# Patient Record
Sex: Female | Born: 1975 | Race: Black or African American | Hispanic: No | Marital: Single | Smoking: Never smoker
Health system: Southern US, Community
[De-identification: ages and names within clinical notes are randomized; demographics above are authoritative.]

## PROBLEM LIST (undated history)

## (undated) DIAGNOSIS — E039 Hypothyroidism, unspecified: Secondary | ICD-10-CM

## (undated) DIAGNOSIS — E119 Type 2 diabetes mellitus without complications: Secondary | ICD-10-CM

## (undated) DIAGNOSIS — K829 Disease of gallbladder, unspecified: Secondary | ICD-10-CM

## (undated) DIAGNOSIS — R0600 Dyspnea, unspecified: Secondary | ICD-10-CM

## (undated) DIAGNOSIS — I10 Essential (primary) hypertension: Secondary | ICD-10-CM

## (undated) DIAGNOSIS — D649 Anemia, unspecified: Secondary | ICD-10-CM

## (undated) DIAGNOSIS — G4730 Sleep apnea, unspecified: Secondary | ICD-10-CM

## (undated) DIAGNOSIS — E669 Obesity, unspecified: Secondary | ICD-10-CM

## (undated) DIAGNOSIS — M2550 Pain in unspecified joint: Secondary | ICD-10-CM

## (undated) DIAGNOSIS — M549 Dorsalgia, unspecified: Secondary | ICD-10-CM

## (undated) DIAGNOSIS — R87629 Unspecified abnormal cytological findings in specimens from vagina: Secondary | ICD-10-CM

## (undated) HISTORY — PX: CHOLECYSTECTOMY: SHX55

## (undated) HISTORY — DX: Unspecified abnormal cytological findings in specimens from vagina: R87.629

## (undated) HISTORY — DX: Type 2 diabetes mellitus without complications: E11.9

## (undated) HISTORY — DX: Disease of gallbladder, unspecified: K82.9

## (undated) HISTORY — DX: Essential (primary) hypertension: I10

## (undated) HISTORY — DX: Pain in unspecified joint: M25.50

## (undated) HISTORY — DX: Sleep apnea, unspecified: G47.30

## (undated) HISTORY — DX: Obesity, unspecified: E66.9

## (undated) HISTORY — DX: Dyspnea, unspecified: R06.00

## (undated) HISTORY — PX: COLPOSCOPY: SHX161

## (undated) HISTORY — DX: Hypothyroidism, unspecified: E03.9

## (undated) HISTORY — DX: Dorsalgia, unspecified: M54.9

## (undated) HISTORY — DX: Anemia, unspecified: D64.9

---

## 1999-11-05 ENCOUNTER — Other Ambulatory Visit: Admission: RE | Admit: 1999-11-05 | Discharge: 1999-11-05 | Payer: Self-pay | Admitting: Obstetrics and Gynecology

## 2000-11-17 ENCOUNTER — Other Ambulatory Visit: Admission: RE | Admit: 2000-11-17 | Discharge: 2000-11-17 | Payer: Self-pay | Admitting: Obstetrics and Gynecology

## 2002-09-05 ENCOUNTER — Encounter: Payer: Self-pay | Admitting: Endocrinology

## 2002-09-05 ENCOUNTER — Ambulatory Visit (HOSPITAL_COMMUNITY): Admission: RE | Admit: 2002-09-05 | Discharge: 2002-09-05 | Payer: Self-pay | Admitting: Endocrinology

## 2002-09-20 ENCOUNTER — Ambulatory Visit (HOSPITAL_COMMUNITY): Admission: RE | Admit: 2002-09-20 | Discharge: 2002-09-20 | Payer: Self-pay | Admitting: Endocrinology

## 2002-09-20 ENCOUNTER — Encounter: Payer: Self-pay | Admitting: Endocrinology

## 2002-11-21 ENCOUNTER — Other Ambulatory Visit: Admission: RE | Admit: 2002-11-21 | Discharge: 2002-11-21 | Payer: Self-pay | Admitting: Obstetrics and Gynecology

## 2002-11-21 ENCOUNTER — Other Ambulatory Visit: Admission: RE | Admit: 2002-11-21 | Discharge: 2002-11-21 | Payer: Self-pay | Admitting: *Deleted

## 2003-11-27 ENCOUNTER — Other Ambulatory Visit: Admission: RE | Admit: 2003-11-27 | Discharge: 2003-11-27 | Payer: Self-pay | Admitting: Obstetrics and Gynecology

## 2006-10-01 ENCOUNTER — Emergency Department (HOSPITAL_COMMUNITY): Admission: EM | Admit: 2006-10-01 | Discharge: 2006-10-01 | Payer: Self-pay | Admitting: Emergency Medicine

## 2007-09-02 HISTORY — PX: BUNIONECTOMY: SHX129

## 2013-10-24 ENCOUNTER — Other Ambulatory Visit: Payer: Self-pay | Admitting: Family Medicine

## 2013-10-24 DIAGNOSIS — R109 Unspecified abdominal pain: Secondary | ICD-10-CM

## 2013-10-26 ENCOUNTER — Ambulatory Visit
Admission: RE | Admit: 2013-10-26 | Discharge: 2013-10-26 | Disposition: A | Discharge status: Home / Self Care | Payer: 59 | Source: Ambulatory Visit | Attending: Family Medicine | Admitting: Family Medicine

## 2013-10-26 DIAGNOSIS — R109 Unspecified abdominal pain: Secondary | ICD-10-CM

## 2013-10-27 ENCOUNTER — Other Ambulatory Visit: Payer: Self-pay

## 2013-10-31 ENCOUNTER — Ambulatory Visit (INDEPENDENT_AMBULATORY_CARE_PROVIDER_SITE_OTHER): Payer: BC Managed Care – PPO | Admitting: Surgery

## 2013-10-31 ENCOUNTER — Encounter (INDEPENDENT_AMBULATORY_CARE_PROVIDER_SITE_OTHER): Payer: Self-pay | Admitting: Surgery

## 2013-10-31 VITALS — BP 132/76 | HR 86 | Temp 98.0°F | Resp 18 | Ht 66.0 in | Wt 220.0 lb

## 2013-10-31 DIAGNOSIS — K8020 Calculus of gallbladder without cholecystitis without obstruction: Secondary | ICD-10-CM | POA: Insufficient documentation

## 2013-10-31 NOTE — Patient Instructions (Signed)
Laparoscopic Cholecystectomy °Laparoscopic cholecystectomy is surgery to remove the gallbladder. The gallbladder is located in the upper right part of the abdomen, behind the liver. It is a storage sac for bile produced in the liver. Bile aids in the digestion and absorption of fats. Cholecystectomy is often done for inflammation of the gallbladder (cholecystitis). This condition is usually caused by a buildup of gallstones (cholelithiasis) in your gallbladder. Gallstones can block the flow of bile, resulting in inflammation and pain. In severe cases, emergency surgery may be required. When emergency surgery is not required, you will have time to prepare for the procedure. °Laparoscopic surgery is an alternative to open surgery. Laparoscopic surgery has a shorter recovery time. Your common bile duct may also need to be examined during the procedure. If stones are found in the common bile duct, they may be removed. °LET YOUR HEALTH CARE PROVIDER KNOW ABOUT: °· Any allergies you have. °· All medicines you are taking, including vitamins, herbs, eye drops, creams, and over-the-counter medicines. °· Previous problems you or members of your family have had with the use of anesthetics. °· Any blood disorders you have. °· Previous surgeries you have had. °· Medical conditions you have. °RISKS AND COMPLICATIONS °Generally, this is a safe procedure. However, as with any procedure, complications can occur. Possible complications include: °· Infection. °· Damage to the common bile duct, nerves, arteries, veins, or other internal organs such as the stomach, liver, or intestines. °· Bleeding. °· A stone may remain in the common bile duct. °· A bile leak from the cyst duct that is clipped when your gallbladder is removed. °· The need to convert to open surgery, which requires a larger incision in the abdomen. This may be necessary if your surgeon thinks it is not safe to continue with a laparoscopic procedure. °BEFORE THE  PROCEDURE °· Ask your health care provider about changing or stopping any regular medicines. You will need to stop taking aspirin or blood thinners at least 5 days prior to surgery. °· Do not eat or drink anything after midnight the night before surgery. °· Let your health care provider know if you develop a cold or other infectious problem before surgery. °PROCEDURE  °· You will be given medicine to make you sleep through the procedure (general anesthetic). A breathing tube will be placed in your mouth. °· When you are asleep, your surgeon will make several small cuts (incisions) in your abdomen. °· A thin, lighted tube with a tiny camera on the end (laparoscope) is inserted through one of the small incisions. The camera on the laparoscope sends a picture to a TV screen in the operating room. This gives the surgeon a good view inside your abdomen. °· A gas will be pumped into your abdomen. This expands your abdomen so that the surgeon has more room to perform the surgery. °· Other tools needed for the procedure are inserted through the other incisions. The gallbladder is removed through one of the incisions. °· After the removal of your gallbladder, the incisions will be closed with stitches, staples, or skin glue. °AFTER THE PROCEDURE °· You will be taken to a recovery area where your progress will be checked often. °· You may be allowed to go home the same day if your pain is controlled and you can tolerate liquids. °Document Released: 08/18/2005 Document Revised: 06/08/2013 Document Reviewed: 03/30/2013 °[employer]® Patient Information ©2014 [employer]. ° °

## 2013-10-31 NOTE — Progress Notes (Signed)
Patient ID: Sherry Palmer, female   DOB: November 19, 1975, 38 y.o.   MRN: [id number]  Chief Complaint  Patient presents with  . New Evaluation    Gallstones    HPI Sherry Palmer is Palmer 38 y.o. female.  Pt sent at request of Dr Darcus Austin for RUQ abdominal pain and gallstones.  U/S done shows Gallstones. Hx of RUQ pain after eating.  Last 2 hours or so. Sharp but more uncomfortable.  No nausea or vomiting.  Pain gets better on its own.  No radiations.  HPI  History reviewed. No pertinent past medical history.  Past Surgical History  Procedure Laterality Date  . Bunionectomy  2009    Family History  Problem Relation Age of Onset  . Hypertension Mother   . Hypothyroidism Mother   . Diabetes Mother   . Hyperlipidemia Mother   . Gout Mother   . Cancer Father     colon    Social History History  Substance Use Topics  . Smoking status: Never Smoker   . Smokeless tobacco: Not on file  . Alcohol Use: Not on file    Allergies  Allergen Reactions  . Amoxicillin Hives    Current Outpatient Prescriptions  Medication Sig Dispense Refill  . levothyroxine (LEVOTHROID) 137 MCG tablet Take 137 mcg by mouth daily before breakfast.      . losartan-hydrochlorothiazide (HYZAAR) 100-25 MG per tablet Take 1 tablet by mouth daily.      . metFORMIN (GLUCOPHAGE) 500 MG tablet Take by mouth 2 (two) times daily with Palmer meal.      . norethindrone-ethinyl estradiol (MICROGESTIN,JUNEL,LOESTRIN) 1-20 MG-MCG tablet Take 1 tablet by mouth daily.       No current facility-administered medications for this visit.    Review of Systems Review of Systems  Constitutional: Negative for fever, chills and unexpected weight change.  HENT: Negative for congestion, hearing loss, sore throat, trouble swallowing and voice change.   Eyes: Negative for visual disturbance.  Respiratory: Negative for cough and wheezing.   Cardiovascular: Negative for chest pain, palpitations and leg swelling.  Gastrointestinal:  Negative for nausea, vomiting, abdominal pain, diarrhea, constipation, blood in stool, abdominal distention and anal bleeding.  Genitourinary: Negative for hematuria, vaginal bleeding and difficulty urinating.  Musculoskeletal: Negative for arthralgias.  Skin: Negative for rash and wound.  Neurological: Negative for seizures, syncope and headaches.  Hematological: Negative for adenopathy. Does not bruise/bleed easily.  Psychiatric/Behavioral: Negative for confusion.    Blood pressure 132/76, pulse 86, temperature 98 F (36.7 C), resp. rate 18, height 5\' 6"  (1.676 m), weight 220 lb (99.791 kg).  Physical Exam Physical Exam  Constitutional: She is oriented to person, place, and time. She appears well-developed and well-nourished.  HENT:  Head: Normocephalic and atraumatic.  Eyes: Pupils are equal, round, and reactive to light. No scleral icterus.  Neck: Normal range of motion. Neck supple.  Cardiovascular: Normal rate and regular rhythm.   Pulmonary/Chest: Effort normal and breath sounds normal.  Abdominal: Soft. Bowel sounds are normal. She exhibits no distension. There is no tenderness. There is no rebound.  Musculoskeletal: Normal range of motion.  Neurological: She is alert and oriented to person, place, and time.  Skin: Skin is warm and dry.  Psychiatric: She has Palmer normal mood and affect. Her behavior is normal. Judgment and thought content normal.    Data Reviewed Gallstones without CBD dilation.    Assessment    Gallstones with symptoms.     Plan  Laparoscopic cholecystectomy.  The procedure has been discussed with the patient. Operative and non operative treatments have been discussed. Risks of surgery include bleeding, infection,  Common bile duct injury,  Injury to the stomach,liver, colon,small intestine, abdominal wall,  Diaphragm,  Major blood vessels,  And the need for an open procedure.  Other risks include worsening of medical problems, death,  DVT and pulmonary  embolism, and cardiovascular events.   Medical options have also been discussed. The patient has been informed of long term expectations of surgery and non surgical options,  The patient agrees to proceed.         Sherry Palmer. 10/31/2013, 3:25 PM

## 2013-11-30 ENCOUNTER — Other Ambulatory Visit (INDEPENDENT_AMBULATORY_CARE_PROVIDER_SITE_OTHER): Payer: Self-pay | Admitting: Surgery

## 2013-11-30 ENCOUNTER — Other Ambulatory Visit (INDEPENDENT_AMBULATORY_CARE_PROVIDER_SITE_OTHER): Payer: Self-pay

## 2013-11-30 DIAGNOSIS — K8010 Calculus of gallbladder with chronic cholecystitis without obstruction: Secondary | ICD-10-CM

## 2013-11-30 MED ORDER — PROMETHAZINE HCL 12.5 MG PO TABS: 12.5000 mg | ORAL_TABLET | Freq: Four times a day (QID) | ORAL | Status: DC | PRN

## 2013-11-30 MED ORDER — OXYCODONE-ACETAMINOPHEN 5-325 MG PO TABS: 1.0000 | ORAL_TABLET | ORAL | Status: DC | PRN

## 2013-12-12 ENCOUNTER — Ambulatory Visit (INDEPENDENT_AMBULATORY_CARE_PROVIDER_SITE_OTHER): Payer: BC Managed Care – PPO | Admitting: Surgery

## 2013-12-12 ENCOUNTER — Encounter (INDEPENDENT_AMBULATORY_CARE_PROVIDER_SITE_OTHER): Payer: Self-pay | Admitting: Surgery

## 2013-12-12 VITALS — BP 124/82 | HR 84 | Resp 14 | Ht 67.0 in | Wt 219.4 lb

## 2013-12-12 DIAGNOSIS — Z9889 Other specified postprocedural states: Secondary | ICD-10-CM | POA: Insufficient documentation

## 2013-12-12 NOTE — Patient Instructions (Signed)
Return as needed.  Resume full activity. 

## 2013-12-12 NOTE — Progress Notes (Signed)
she is here for a postop visit following laparoscopic cholecystectomy.  Diet is being tolerated, bowels are moving.  No problems with incisions.  PE:  ABD:  Soft, incisions clean/dry/intact and solid.  Assessment:  Doing well postop.  Plan:  Lowfat diet recommended.  Activities as tolerated.  Return visit prn.

## 2014-03-20 ENCOUNTER — Other Ambulatory Visit: Payer: Self-pay | Admitting: Family Medicine

## 2014-03-20 DIAGNOSIS — R932 Abnormal findings on diagnostic imaging of liver and biliary tract: Secondary | ICD-10-CM

## 2014-03-31 ENCOUNTER — Ambulatory Visit
Admission: RE | Admit: 2014-03-31 | Discharge: 2014-03-31 | Disposition: A | Discharge status: Home / Self Care | Payer: 59 | Source: Ambulatory Visit | Attending: Family Medicine | Admitting: Family Medicine

## 2014-03-31 DIAGNOSIS — R932 Abnormal findings on diagnostic imaging of liver and biliary tract: Secondary | ICD-10-CM

## 2014-04-04 ENCOUNTER — Ambulatory Visit
Admission: RE | Admit: 2014-04-04 | Discharge: 2014-04-04 | Disposition: A | Discharge status: Home / Self Care | Payer: 59 | Source: Ambulatory Visit | Attending: Family Medicine | Admitting: Family Medicine

## 2014-04-04 ENCOUNTER — Other Ambulatory Visit: Payer: Self-pay | Admitting: Family Medicine

## 2014-04-04 DIAGNOSIS — R935 Abnormal findings on diagnostic imaging of other abdominal regions, including retroperitoneum: Secondary | ICD-10-CM

## 2014-04-04 MED ORDER — IOHEXOL 300 MG/ML  SOLN
40.0000 mL | Freq: Once | INTRAMUSCULAR | Status: AC | PRN
  Administered 2014-04-04: 40 mL via ORAL

## 2014-04-04 MED ORDER — IOHEXOL 300 MG/ML  SOLN
125.0000 mL | Freq: Once | INTRAMUSCULAR | Status: AC | PRN
  Administered 2014-04-04: 125 mL via INTRAVENOUS

## 2014-04-05 ENCOUNTER — Other Ambulatory Visit: Payer: Self-pay | Admitting: Family Medicine

## 2014-04-05 DIAGNOSIS — R160 Hepatomegaly, not elsewhere classified: Secondary | ICD-10-CM

## 2014-04-09 ENCOUNTER — Ambulatory Visit
Admission: RE | Admit: 2014-04-09 | Discharge: 2014-04-09 | Disposition: A | Discharge status: Home / Self Care | Payer: 59 | Source: Ambulatory Visit | Attending: Family Medicine | Admitting: Family Medicine

## 2014-04-09 DIAGNOSIS — R160 Hepatomegaly, not elsewhere classified: Secondary | ICD-10-CM

## 2014-04-09 MED ORDER — GADOXETATE DISODIUM 0.25 MMOL/ML IV SOLN
9.0000 mL | Freq: Once | INTRAVENOUS | Status: AC | PRN
  Administered 2014-04-09: 9 mL via INTRAVENOUS

## 2014-08-01 ENCOUNTER — Other Ambulatory Visit: Payer: Self-pay | Admitting: Family Medicine

## 2014-08-01 DIAGNOSIS — D134 Benign neoplasm of liver: Secondary | ICD-10-CM

## 2014-08-14 ENCOUNTER — Ambulatory Visit
Admission: RE | Admit: 2014-08-14 | Discharge: 2014-08-14 | Disposition: A | Discharge status: Home / Self Care | Payer: 59 | Source: Ambulatory Visit | Attending: Family Medicine | Admitting: Family Medicine

## 2014-08-14 DIAGNOSIS — D134 Benign neoplasm of liver: Secondary | ICD-10-CM

## 2014-08-14 MED ORDER — GADOXETATE DISODIUM 0.25 MMOL/ML IV SOLN
10.0000 mL | Freq: Once | INTRAVENOUS | Status: AC | PRN
  Administered 2014-08-14: 10 mL via INTRAVENOUS

## 2015-08-23 ENCOUNTER — Other Ambulatory Visit: Payer: Self-pay | Admitting: Family Medicine

## 2015-08-23 DIAGNOSIS — D134 Benign neoplasm of liver: Secondary | ICD-10-CM

## 2015-09-02 NOTE — L&D Delivery Note (Signed)
Delivery Note At 9:41 PM a viable and healthy female was delivered via Vaginal, Spontaneous Delivery (Presentation: ROA, vtx;  ).  APGAR:8 ,9 ; weight pending .   Placenta status: spontaneous, intact not sent, .  Cord:  with the following complications: none.  Cord pH: none  Anesthesia:  epidural Episiotomy: None Lacerations: None Suture Repair: n/a Est. Blood Loss (mL): 250  Mom to postpartum.  Baby to Couplet care / Skin to Skin.  Sherry Palmer A 08/31/2016, 10:47 PM

## 2015-09-04 ENCOUNTER — Ambulatory Visit
Admission: RE | Admit: 2015-09-04 | Discharge: 2015-09-04 | Disposition: A | Discharge status: Home / Self Care | Payer: Commercial Managed Care - HMO | Source: Ambulatory Visit | Attending: Family Medicine | Admitting: Family Medicine

## 2015-09-04 DIAGNOSIS — D134 Benign neoplasm of liver: Secondary | ICD-10-CM

## 2015-09-04 MED ORDER — GADOXETATE DISODIUM 0.25 MMOL/ML IV SOLN
10.0000 mL | Freq: Once | INTRAVENOUS | Status: AC | PRN
  Administered 2015-09-04: 10 mL via INTRAVENOUS

## 2015-10-27 IMAGING — US US ABDOMEN COMPLETE
1 series · 13 of 25 positions shown · non-contrast
Comparison: Ultrasound 10/26/2013.

CLINICAL DATA: Follow-up liver abnormality.

EXAM:
ULTRASOUND ABDOMEN COMPLETE

[Series 1: us abdomen complete · 0.41mm/px · 13 of 81 slices shown]
[im 1/81]
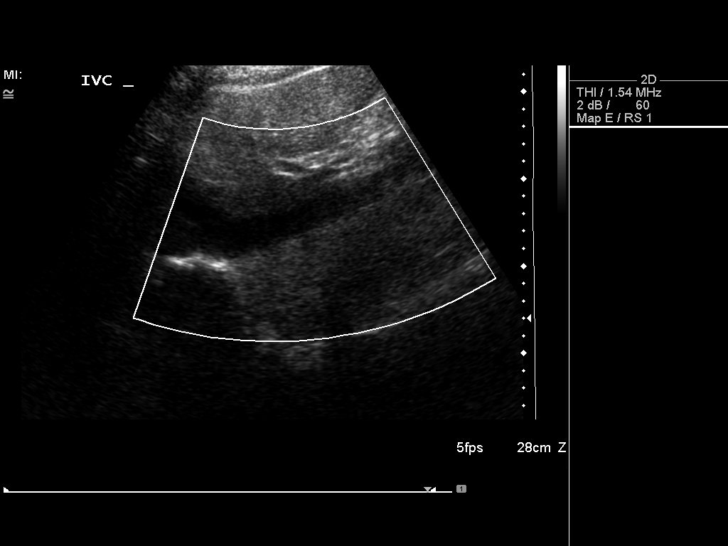
[im 7/81]
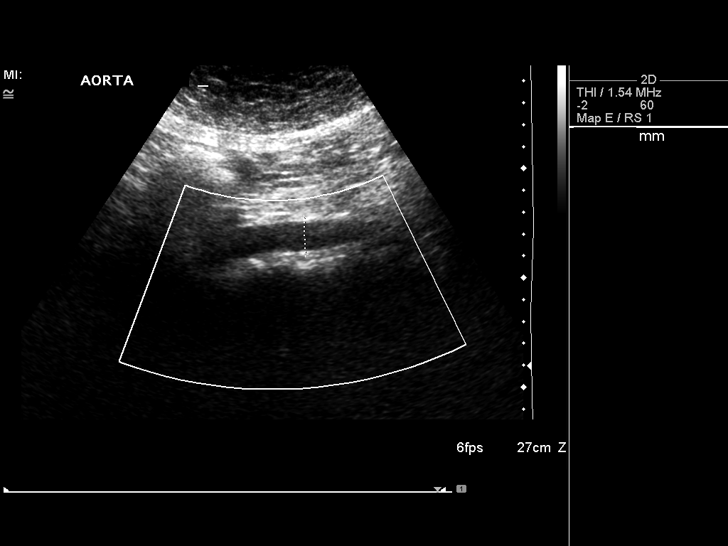
[im 14/81]
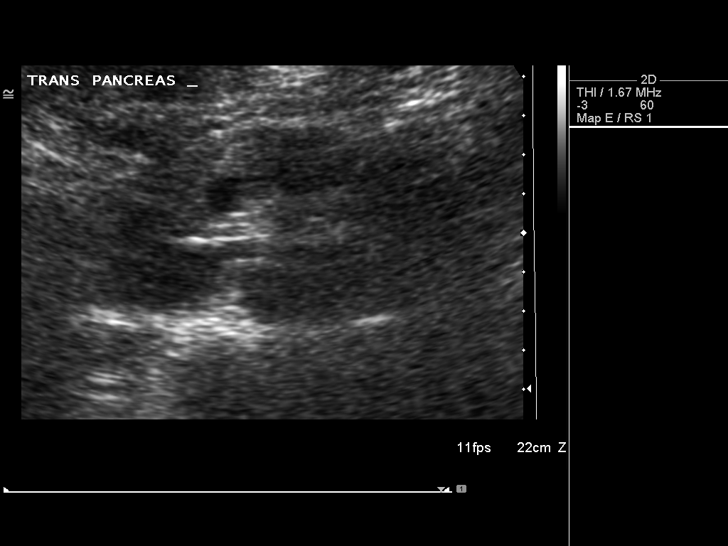
[im 21/81]
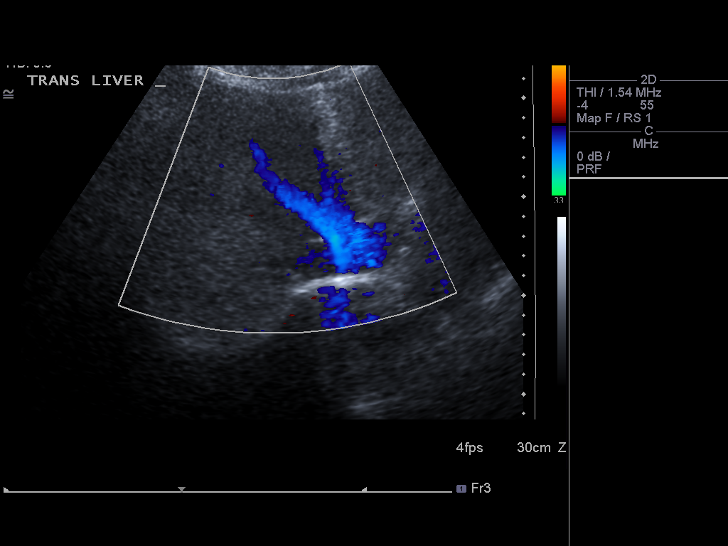
[im 27/81]
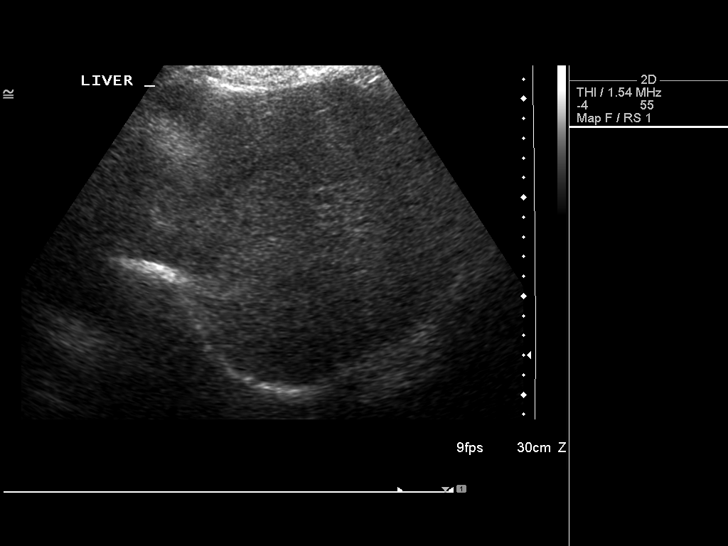
[im 34/81]
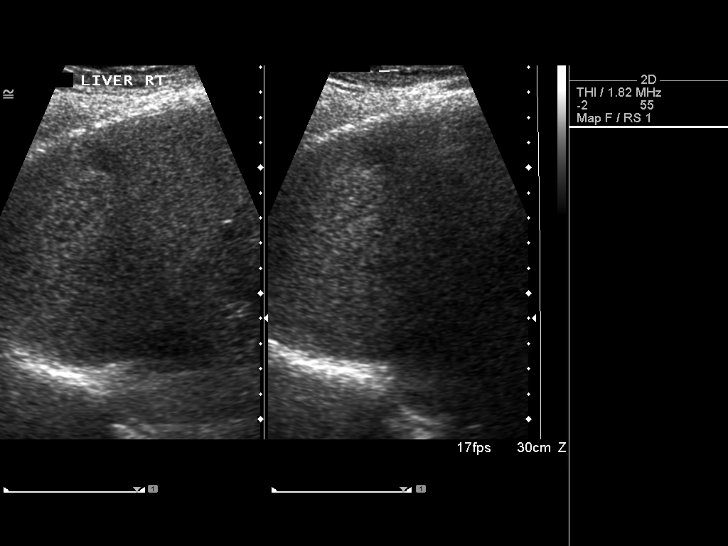
[im 41/81]
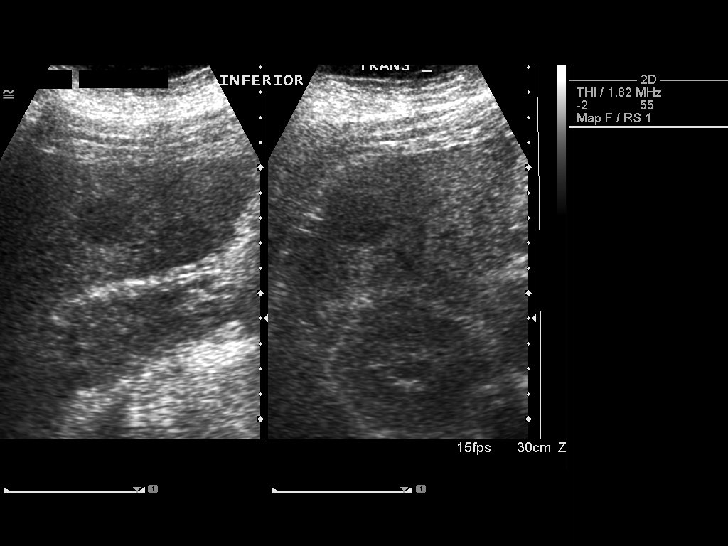
[im 47/81]
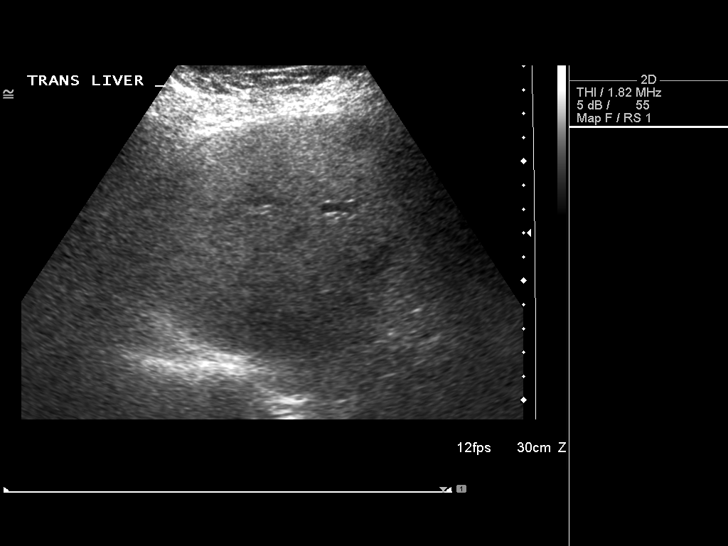
[im 54/81]
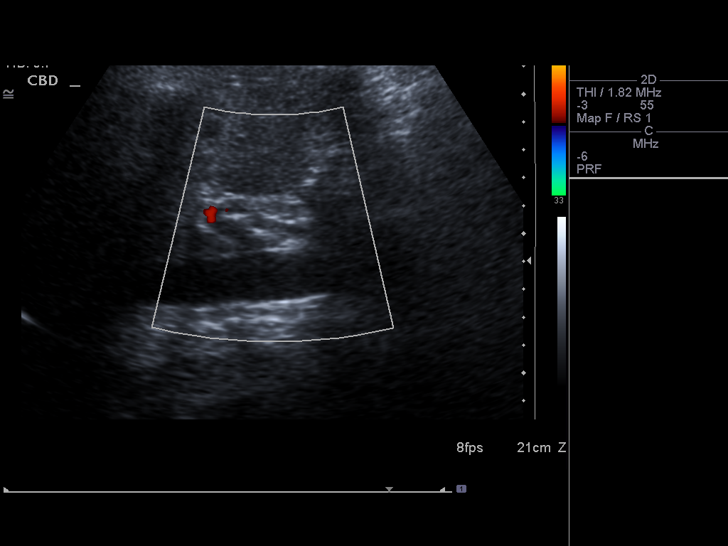
[im 61/81]
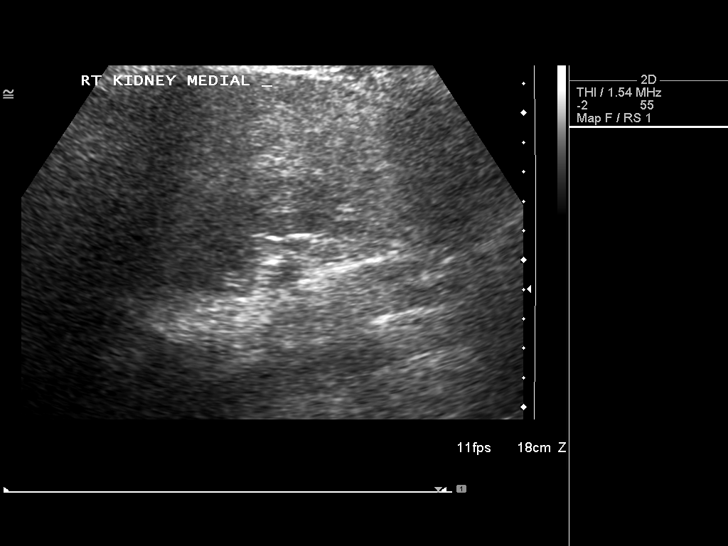
[im 67/81]
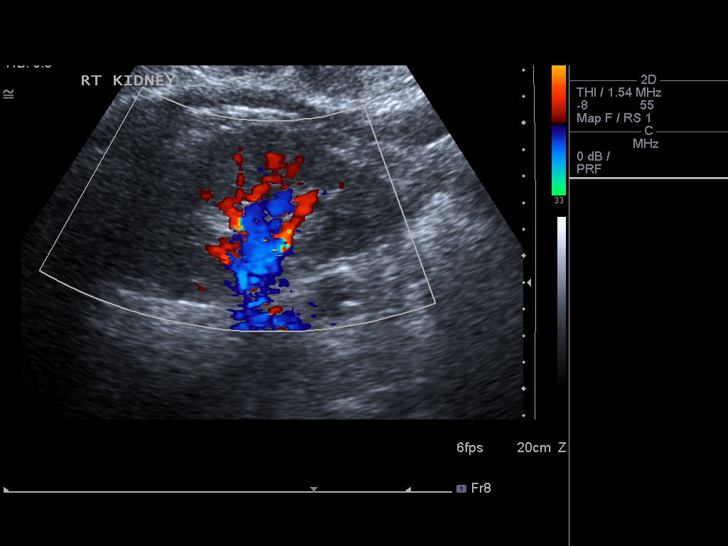
[im 74/81]
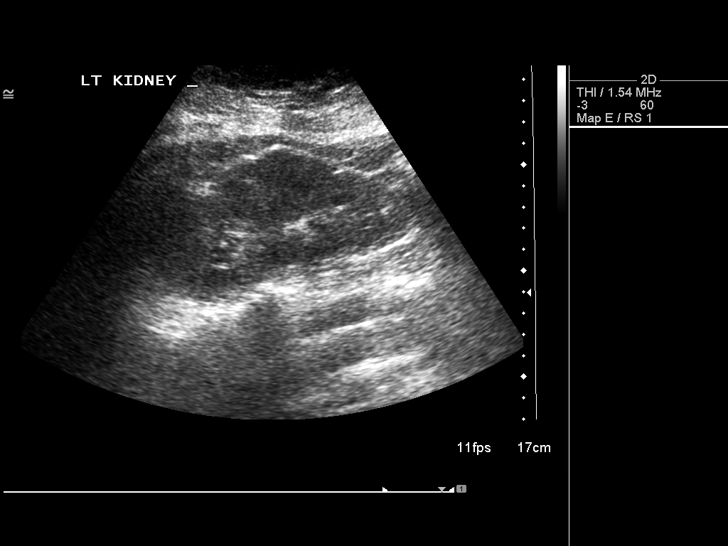
[im 81/81]
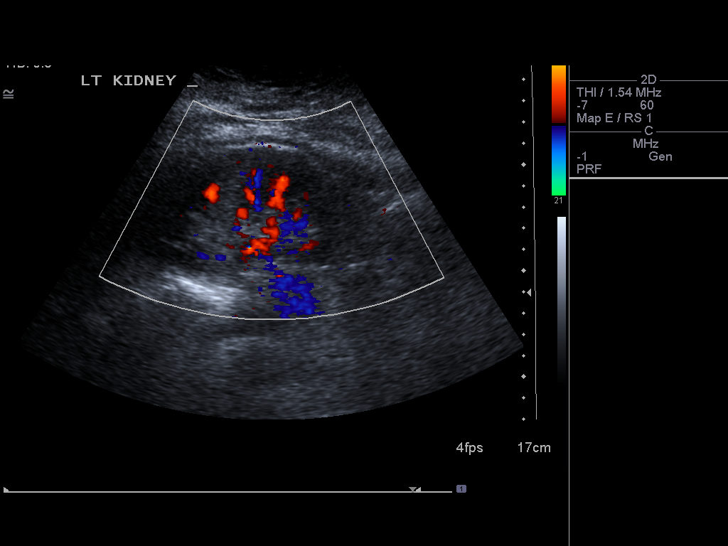

[13 of 25 positions shown; findings below may reference images not displayed]

FINDINGS: Gallbladder:

Cholecystectomy.

Common bile duct:

Diameter: 4.8 mm

Liver:

Noted on today's examination are multiple new solid appearing
hypoechoic nodules throughout both the right and left hepatic lobes.
Previously identified 2.3 cm left hepatic lobe hypoechoic nodule has
increased in size to approximately 3.5 cm. These lesions are very
concerning for metastasis. Contrast enhanced CT of the abdomen
pelvis suggested to further evaluate. Liver is hyperechoic
suggesting fatty infiltration.

IVC:

No abnormality visualized.

Pancreas:

Visualized portion unremarkable.

Spleen:

Size and appearance within normal limits.

Right Kidney:

Length: 11.0 cm. Echogenicity within normal limits. No mass or
hydronephrosis visualized.

Left Kidney:

Length: 12.1 cm. Echogenicity within normal limits. No mass or
hydronephrosis visualized.

Abdominal aorta:

No aneurysm visualized.

Other findings:

None.
IMPRESSION: 1. New onset of multiple solid-appearing hypoechoic nodules
throughout the liver. Interim growth of previously identified left
hepatic nodule from 2.3 cm to approximately 3.5 cm. These are very
concerning for metastatic lesions. Contrast-enhanced CT of the
abdomen and pelvis suggested to further evaluate.
2. Cholecystectomy.

## 2016-02-07 LAB — OB RESULTS CONSOLE GC/CHLAMYDIA
Chlamydia: NEGATIVE
Gonorrhea: NEGATIVE

## 2016-02-07 LAB — OB RESULTS CONSOLE ABO/RH: RH TYPE: POSITIVE

## 2016-02-07 LAB — OB RESULTS CONSOLE HIV ANTIBODY (ROUTINE TESTING): HIV: NONREACTIVE

## 2016-02-07 LAB — OB RESULTS CONSOLE HEPATITIS B SURFACE ANTIGEN: Hepatitis B Surface Ag: NEGATIVE

## 2016-02-07 LAB — OB RESULTS CONSOLE RUBELLA ANTIBODY, IGM: Rubella: IMMUNE

## 2016-02-07 LAB — OB RESULTS CONSOLE ANTIBODY SCREEN: Antibody Screen: NEGATIVE

## 2016-02-07 LAB — OB RESULTS CONSOLE RPR: RPR: NONREACTIVE

## 2016-03-12 ENCOUNTER — Encounter: Payer: Self-pay | Admitting: *Deleted

## 2016-03-12 ENCOUNTER — Encounter: Payer: 59 | Attending: Obstetrics and Gynecology | Admitting: *Deleted

## 2016-03-12 VITALS — Ht 67.0 in | Wt 230.1 lb

## 2016-03-12 DIAGNOSIS — O24319 Unspecified pre-existing diabetes mellitus in pregnancy, unspecified trimester: Secondary | ICD-10-CM | POA: Insufficient documentation

## 2016-03-12 DIAGNOSIS — E119 Type 2 diabetes mellitus without complications: Secondary | ICD-10-CM | POA: Diagnosis not present

## 2016-03-12 DIAGNOSIS — Z713 Dietary counseling and surveillance: Secondary | ICD-10-CM | POA: Diagnosis not present

## 2016-03-12 DIAGNOSIS — I10 Essential (primary) hypertension: Secondary | ICD-10-CM | POA: Insufficient documentation

## 2016-03-12 DIAGNOSIS — O24111 Pre-existing diabetes mellitus, type 2, in pregnancy, first trimester: Secondary | ICD-10-CM

## 2016-03-12 DIAGNOSIS — E669 Obesity, unspecified: Secondary | ICD-10-CM | POA: Insufficient documentation

## 2016-03-14 NOTE — Patient Instructions (Signed)
Plan:  Aim for 3 Carb Choices per meal (45 grams) +/- 1 either way  Aim for 0-1 Carbs per snack if hungry  Include protein in moderation with your meals and snacks Consider reading food labels for Total Carbohydrate of foods Consider  increasing your activity level by walking daily as tolerated Consider checking BG at alternate times per day  Continue taking diabetes medication as directed by MD

## 2016-03-14 NOTE — Progress Notes (Signed)
  Patient was seen on 03/12/16 for Type 2 Diabetes with pregnancy at the [facility]. The following learning objectives were met by the patient:   States the definition of type 2 Diabetes with pregnancy  States why dietary management is important in controlling blood glucose  Describes the effects each nutrient has on blood glucose levels  Demonstrates ability to create a balanced meal plan  Demonstrates carbohydrate counting   States when to check blood glucose levels  Demonstrates proper blood glucose monitoring techniques  States the effect of stress and exercise on blood glucose levels  States the importance of limiting caffeine and abstaining from alcohol and smoking  Action of Metformin and Glyburide  Potential causes, symptoms, treatment and prevention of hypoglycemia  Patient already has a meter and is testing pre breakfast and 2 hours post each meal. She states her FBG are running 120-125 and post meal 135-140 mg/dl. She has just filled her Rx for Glyburide and started that today pre breakfast and pre supper which should have a positive impact on her BG control.   Patient instructed to monitor glucose levels: FBS: 60 - <90 2 hour: <120  *Patient received handouts:  Nutrition Diabetes and Pregnancy  Carbohydrate Counting List  Patient will be seen for follow-up as needed.

## 2016-08-21 LAB — OB RESULTS CONSOLE GBS: GBS: POSITIVE

## 2016-08-27 ENCOUNTER — Other Ambulatory Visit: Payer: Self-pay | Admitting: Obstetrics and Gynecology

## 2016-08-27 ENCOUNTER — Telehealth (HOSPITAL_COMMUNITY): Payer: Self-pay | Admitting: *Deleted

## 2016-08-27 ENCOUNTER — Encounter (HOSPITAL_COMMUNITY): Payer: Self-pay | Admitting: *Deleted

## 2016-08-28 NOTE — Telephone Encounter (Signed)
Preadmission screen  

## 2016-08-31 ENCOUNTER — Inpatient Hospital Stay (HOSPITAL_COMMUNITY)
Admission: AD | Admit: 2016-08-31 | Discharge: 2016-08-31 | Disposition: A | Discharge status: Home / Self Care | Payer: 59 | Source: Ambulatory Visit | Attending: Obstetrics and Gynecology | Admitting: Obstetrics and Gynecology

## 2016-08-31 ENCOUNTER — Encounter (HOSPITAL_COMMUNITY): Payer: Self-pay | Admitting: *Deleted

## 2016-08-31 ENCOUNTER — Inpatient Hospital Stay (HOSPITAL_COMMUNITY): Payer: 59 | Admitting: Anesthesiology

## 2016-08-31 ENCOUNTER — Encounter (HOSPITAL_COMMUNITY)
Admission: AD | Disposition: A | Discharge status: Home / Self Care | Payer: Self-pay | Source: Ambulatory Visit | Attending: Obstetrics and Gynecology

## 2016-08-31 ENCOUNTER — Inpatient Hospital Stay (HOSPITAL_COMMUNITY)
Admission: AD | Admit: 2016-08-31 | Discharge: 2016-09-02 | DRG: 774 | Disposition: A | Discharge status: Home / Self Care | Payer: 59 | Source: Ambulatory Visit | Attending: Obstetrics and Gynecology | Admitting: Obstetrics and Gynecology

## 2016-08-31 ENCOUNTER — Encounter (HOSPITAL_COMMUNITY): Payer: Self-pay

## 2016-08-31 DIAGNOSIS — Z8249 Family history of ischemic heart disease and other diseases of the circulatory system: Secondary | ICD-10-CM | POA: Diagnosis not present

## 2016-08-31 DIAGNOSIS — O4202 Full-term premature rupture of membranes, onset of labor within 24 hours of rupture: Secondary | ICD-10-CM | POA: Diagnosis present

## 2016-08-31 DIAGNOSIS — E039 Hypothyroidism, unspecified: Secondary | ICD-10-CM | POA: Diagnosis present

## 2016-08-31 DIAGNOSIS — E119 Type 2 diabetes mellitus without complications: Secondary | ICD-10-CM | POA: Diagnosis present

## 2016-08-31 DIAGNOSIS — O2412 Pre-existing diabetes mellitus, type 2, in childbirth: Secondary | ICD-10-CM | POA: Diagnosis present

## 2016-08-31 DIAGNOSIS — Z3A38 38 weeks gestation of pregnancy: Secondary | ICD-10-CM | POA: Diagnosis not present

## 2016-08-31 DIAGNOSIS — O1002 Pre-existing essential hypertension complicating childbirth: Secondary | ICD-10-CM | POA: Diagnosis present

## 2016-08-31 DIAGNOSIS — O9081 Anemia of the puerperium: Secondary | ICD-10-CM | POA: Diagnosis not present

## 2016-08-31 DIAGNOSIS — O99824 Streptococcus B carrier state complicating childbirth: Secondary | ICD-10-CM | POA: Diagnosis present

## 2016-08-31 DIAGNOSIS — Z880 Allergy status to penicillin: Secondary | ICD-10-CM

## 2016-08-31 DIAGNOSIS — O99284 Endocrine, nutritional and metabolic diseases complicating childbirth: Secondary | ICD-10-CM | POA: Diagnosis present

## 2016-08-31 DIAGNOSIS — D573 Sickle-cell trait: Secondary | ICD-10-CM | POA: Diagnosis present

## 2016-08-31 DIAGNOSIS — Z833 Family history of diabetes mellitus: Secondary | ICD-10-CM | POA: Diagnosis not present

## 2016-08-31 DIAGNOSIS — Z794 Long term (current) use of insulin: Secondary | ICD-10-CM | POA: Diagnosis not present

## 2016-08-31 DIAGNOSIS — Z3493 Encounter for supervision of normal pregnancy, unspecified, third trimester: Secondary | ICD-10-CM | POA: Insufficient documentation

## 2016-08-31 DIAGNOSIS — D62 Acute posthemorrhagic anemia: Secondary | ICD-10-CM | POA: Diagnosis not present

## 2016-08-31 DIAGNOSIS — O10919 Unspecified pre-existing hypertension complicating pregnancy, unspecified trimester: Secondary | ICD-10-CM | POA: Diagnosis present

## 2016-08-31 LAB — GLUCOSE, CAPILLARY
Glucose-Capillary: 121 mg/dL — ABNORMAL HIGH (ref 65–99)
Glucose-Capillary: 97 mg/dL (ref 65–99)
Glucose-Capillary: 95 mg/dL (ref 65–99)
Glucose-Capillary: 97 mg/dL (ref 65–99)
Glucose-Capillary: 112 mg/dL — ABNORMAL HIGH (ref 65–99)

## 2016-08-31 LAB — CBC
HEMATOCRIT: 34.9 % — AB (ref 36.0–46.0)
Hemoglobin: 11.8 g/dL — ABNORMAL LOW (ref 12.0–15.0)
MCH: 23.8 pg — AB (ref 26.0–34.0)
MCHC: 33.8 g/dL (ref 30.0–36.0)
MCV: 70.5 fL — AB (ref 78.0–100.0)
PLATELETS: 286 10*3/uL (ref 150–400)
RBC: 4.95 MIL/uL (ref 3.87–5.11)
RDW: 16.2 % — AB (ref 11.5–15.5)
WBC: 10 10*3/uL (ref 4.0–10.5)

## 2016-08-31 LAB — ABO/RH: ABO/RH(D): O POS

## 2016-08-31 LAB — TYPE AND SCREEN
ABO/RH(D): O POS
Antibody Screen: NEGATIVE

## 2016-08-31 LAB — POCT FERN TEST: POCT Fern Test: POSITIVE

## 2016-08-31 SURGERY — Surgical Case
Anesthesia: Regional

## 2016-08-31 MED ORDER — DIPHENHYDRAMINE HCL 50 MG/ML IJ SOLN: 12.5000 mg | INTRAMUSCULAR | Status: DC | PRN

## 2016-08-31 MED ORDER — TERBUTALINE SULFATE 1 MG/ML IJ SOLN: 0.2500 mg | Freq: Once | INTRAMUSCULAR | Status: DC | PRN

## 2016-08-31 MED ORDER — OXYTOCIN 40 UNITS IN LACTATED RINGERS INFUSION - SIMPLE MED
1.0000 m[IU]/min | INTRAVENOUS | Status: DC
  Administered 2016-08-31: 2 m[IU]/min via INTRAVENOUS
  Filled 2016-08-31: qty 1000

## 2016-08-31 MED ORDER — OXYTOCIN 40 UNITS IN LACTATED RINGERS INFUSION - SIMPLE MED: 1.0000 m[IU]/min | INTRAVENOUS | Status: DC

## 2016-08-31 MED ORDER — TERBUTALINE SULFATE 1 MG/ML IJ SOLN
0.2500 mg | Freq: Once | INTRAMUSCULAR | Status: DC | PRN
  Filled 2016-08-31: qty 1

## 2016-08-31 MED ORDER — BUPIVACAINE HCL (PF) 0.25 % IJ SOLN
INTRAMUSCULAR | Status: AC
  Filled 2016-08-31: qty 10

## 2016-08-31 MED ORDER — PHENYLEPHRINE 40 MCG/ML (10ML) SYRINGE FOR IV PUSH (FOR BLOOD PRESSURE SUPPORT)
80.0000 ug | PREFILLED_SYRINGE | INTRAVENOUS | Status: DC | PRN
  Filled 2016-08-31: qty 5

## 2016-08-31 MED ORDER — LEVOTHYROXINE SODIUM 137 MCG PO TABS
137.0000 ug | ORAL_TABLET | Freq: Every day | ORAL | Status: DC
  Administered 2016-09-01 – 2016-09-02 (×2): 137 ug via ORAL
  Filled 2016-08-31 (×4): qty 1

## 2016-08-31 MED ORDER — OXYCODONE-ACETAMINOPHEN 5-325 MG PO TABS: 2.0000 | ORAL_TABLET | ORAL | Status: DC | PRN

## 2016-08-31 MED ORDER — LACTATED RINGERS IV SOLN
INTRAVENOUS | Status: DC
  Administered 2016-08-31 (×2): via INTRAVENOUS

## 2016-08-31 MED ORDER — LABETALOL HCL 100 MG PO TABS
100.0000 mg | ORAL_TABLET | Freq: Two times a day (BID) | ORAL | Status: DC
  Administered 2016-08-31 – 2016-09-02 (×4): 100 mg via ORAL
  Filled 2016-08-31 (×4): qty 1

## 2016-08-31 MED ORDER — LIDOCAINE HCL (PF) 1 % IJ SOLN
INTRAMUSCULAR | Status: DC | PRN
  Administered 2016-08-31: 4 mL via EPIDURAL

## 2016-08-31 MED ORDER — OXYTOCIN 40 UNITS IN LACTATED RINGERS INFUSION - SIMPLE MED: 2.5000 [IU]/h | INTRAVENOUS | Status: DC

## 2016-08-31 MED ORDER — OXYTOCIN 10 UNIT/ML IJ SOLN: 10.0000 [IU] | Freq: Once | INTRAMUSCULAR | Status: DC

## 2016-08-31 MED ORDER — PHENYLEPHRINE 40 MCG/ML (10ML) SYRINGE FOR IV PUSH (FOR BLOOD PRESSURE SUPPORT)
80.0000 ug | PREFILLED_SYRINGE | INTRAVENOUS | Status: DC | PRN
  Filled 2016-08-31: qty 5
  Filled 2016-08-31: qty 10

## 2016-08-31 MED ORDER — ONDANSETRON HCL 4 MG/2ML IJ SOLN: 4.0000 mg | Freq: Four times a day (QID) | INTRAMUSCULAR | Status: DC | PRN

## 2016-08-31 MED ORDER — LACTATED RINGERS IV SOLN
500.0000 mL | INTRAVENOUS | Status: DC | PRN
  Administered 2016-08-31: 500 mL via INTRAVENOUS

## 2016-08-31 MED ORDER — EPHEDRINE 5 MG/ML INJ
10.0000 mg | INTRAVENOUS | Status: DC | PRN
  Filled 2016-08-31: qty 4

## 2016-08-31 MED ORDER — OXYTOCIN BOLUS FROM INFUSION
500.0000 mL | Freq: Once | INTRAVENOUS | Status: DC
  Administered 2016-08-31: 500 mL via INTRAVENOUS

## 2016-08-31 MED ORDER — VANCOMYCIN HCL IN DEXTROSE 1-5 GM/200ML-% IV SOLN: 1000.0000 mg | Freq: Two times a day (BID) | INTRAVENOUS | Status: DC

## 2016-08-31 MED ORDER — PHENYLEPHRINE 40 MCG/ML (10ML) SYRINGE FOR IV PUSH (FOR BLOOD PRESSURE SUPPORT): 80.0000 ug | PREFILLED_SYRINGE | INTRAVENOUS | Status: DC | PRN

## 2016-08-31 MED ORDER — FENTANYL 2.5 MCG/ML BUPIVACAINE 1/10 % EPIDURAL INFUSION (WH - ANES)
14.0000 mL/h | INTRAMUSCULAR | Status: DC | PRN
  Administered 2016-08-31: 14 mL/h via EPIDURAL
  Filled 2016-08-31: qty 100

## 2016-08-31 MED ORDER — ACETAMINOPHEN 325 MG PO TABS: 650.0000 mg | ORAL_TABLET | ORAL | Status: DC | PRN

## 2016-08-31 MED ORDER — DEXTROSE IN LACTATED RINGERS 5 % IV SOLN: INTRAVENOUS | Status: DC

## 2016-08-31 MED ORDER — SODIUM CHLORIDE 0.9 % IV SOLN
INTRAVENOUS | Status: DC
  Administered 2016-08-31: 0.5 [IU]/h via INTRAVENOUS
  Filled 2016-08-31: qty 2.5

## 2016-08-31 MED ORDER — OXYCODONE-ACETAMINOPHEN 5-325 MG PO TABS: 1.0000 | ORAL_TABLET | ORAL | Status: DC | PRN

## 2016-08-31 MED ORDER — SOD CITRATE-CITRIC ACID 500-334 MG/5ML PO SOLN
30.0000 mL | ORAL | Status: DC | PRN
  Filled 2016-08-31: qty 15

## 2016-08-31 MED ORDER — LACTATED RINGERS IV SOLN: 500.0000 mL | INTRAVENOUS | Status: DC | PRN

## 2016-08-31 MED ORDER — OXYTOCIN BOLUS FROM INFUSION: 500.0000 mL | Freq: Once | INTRAVENOUS | Status: DC

## 2016-08-31 MED ORDER — INSULIN ASPART 100 UNIT/ML ~~LOC~~ SOLN
0.0000 [IU] | Freq: Once | SUBCUTANEOUS | Status: AC
  Administered 2016-08-31: 2 [IU] via SUBCUTANEOUS

## 2016-08-31 MED ORDER — LACTATED RINGERS IV SOLN: 500.0000 mL | Freq: Once | INTRAVENOUS | Status: DC

## 2016-08-31 MED ORDER — VANCOMYCIN HCL IN DEXTROSE 1-5 GM/200ML-% IV SOLN
1000.0000 mg | Freq: Two times a day (BID) | INTRAVENOUS | Status: DC
  Administered 2016-08-31: 1000 mg via INTRAVENOUS
  Filled 2016-08-31 (×2): qty 200

## 2016-08-31 MED ORDER — EPHEDRINE 5 MG/ML INJ: 10.0000 mg | INTRAVENOUS | Status: DC | PRN

## 2016-08-31 MED ORDER — LIDOCAINE HCL (PF) 1 % IJ SOLN: 30.0000 mL | INTRAMUSCULAR | Status: DC | PRN

## 2016-08-31 MED ORDER — SOD CITRATE-CITRIC ACID 500-334 MG/5ML PO SOLN: 30.0000 mL | ORAL | Status: DC | PRN

## 2016-08-31 MED ORDER — LACTATED RINGERS IV SOLN
500.0000 mL | Freq: Once | INTRAVENOUS | Status: AC
  Administered 2016-08-31: 500 mL via INTRAVENOUS

## 2016-08-31 MED ORDER — LIDOCAINE HCL (PF) 1 % IJ SOLN
30.0000 mL | INTRAMUSCULAR | Status: DC | PRN
  Filled 2016-08-31: qty 30

## 2016-08-31 NOTE — Anesthesia Preprocedure Evaluation (Signed)
Anesthesia Evaluation  Patient identified by MRN, date of birth, ID band Patient awake    Reviewed: Allergy & Precautions, Patient's Chart, lab work & pertinent test results  Airway Mallampati: III       Dental  (+) Teeth Intact   Pulmonary neg pulmonary ROS,    breath sounds clear to auscultation       Cardiovascular hypertension, Pt. on medications  Rhythm:Regular Rate:Normal     Neuro/Psych negative neurological ROS  negative psych ROS   GI/Hepatic negative GI ROS, Neg liver ROS,   Endo/Other  diabetes, Type 2, Insulin Dependent, Oral Hypoglycemic AgentsHypothyroidism   Renal/GU negative Renal ROS  negative genitourinary   Musculoskeletal negative musculoskeletal ROS (+)   Abdominal   Peds  Hematology negative hematology ROS (+)   Anesthesia Other Findings   Reproductive/Obstetrics (+) Pregnancy                             Lab Results  Component Value Date   WBC 10.0 08/31/2016   HGB 11.8 (L) 08/31/2016   HCT 34.9 (L) 08/31/2016   MCV 70.5 (L) 08/31/2016   PLT 286 08/31/2016   No results found for: INR, PROTIME   Anesthesia Physical Anesthesia Plan  ASA: II  Anesthesia Plan: Epidural   Post-op Pain Management:    Induction:   Airway Management Planned:   Additional Equipment:   Intra-op Plan:   Post-operative Plan:   Informed Consent: I have reviewed the patients History and Physical, chart, labs and discussed the procedure including the risks, benefits and alternatives for the proposed anesthesia with the patient or authorized representative who has indicated his/her understanding and acceptance.     Plan Discussed with:   Anesthesia Plan Comments:         Anesthesia Quick Evaluation

## 2016-08-31 NOTE — Progress Notes (Signed)
Called by RN for repetitive late decelerations Pitocin off on arrival C/S called based on tracing  VE however showed fully +2 station Decision made to proceed with attempted vaginal delivery With OR on standby Tracing: baseline 135 (+) late decel (+) variable decel Ctx q 1-3 mins IMP: Complete SROPM mod meconium (+) GBS on Vancomycin  Class B DM on insulin drip Chronic HTN on labetalol P) start pushing. Pediatrician for delivery

## 2016-08-31 NOTE — Progress Notes (Signed)
S: notes ctx Plans epidural Took labetalol @ 11 am Did not take insulin this am  had synthroid and metformin this am  O: VS>  BP 158/102 VE per RN : 3.5/80/-2  Tracing baseline 135-140(+) accels Ctx q 1-3 mins  IMP: SROM GBS cx (+) on IV vancomycin Chronic HTN on med Class B DM Hypothyroidism P) novolog sliding scale. Start insulin pump after next BS check. Start Pitocin. Defer IUPC

## 2016-08-31 NOTE — Progress Notes (Signed)
Provider notified of pt in MAU.  Notified that pt is 2, 60, -2, and vertex with bloody show.  Provider notified of maternal blood pressures and that pt states she did not take her labetalol this morning.  Provider notified that pt is contracting every 3-4 minutes.  Provider states to recheck pt in one hour.

## 2016-08-31 NOTE — Anesthesia Procedure Notes (Signed)
Epidural Patient location during procedure: OB Start time: 08/31/2016 5:14 PM End time: 08/31/2016 5:20 PM  Staffing Anesthesiologist: Suella Broad D Performed: anesthesiologist   Preanesthetic Checklist Completed: patient identified, site marked, surgical consent, pre-op evaluation, timeout performed, IV checked, risks and benefits discussed and monitors and equipment checked  Epidural Patient position: sitting Prep: ChloraPrep Patient monitoring: heart rate, continuous pulse ox and blood pressure Approach: midline Location: L3-L4 Injection technique: LOR saline  Needle:  Needle type: Tuohy  Needle gauge: 17 G Needle length: 9 cm Catheter type: closed end flexible Catheter size: 20 Guage Test dose: negative and 1.5% lidocaine  Assessment Events: blood not aspirated, injection not painful, no injection resistance and no paresthesia  Additional Notes LOR @ 7  Patient identified. Risks/Benefits/Options discussed with patient including but not limited to bleeding, infection, nerve damage, paralysis, failed block, incomplete pain control, headache, blood pressure changes, nausea, vomiting, reactions to medications, itching and postpartum back pain. Confirmed with bedside nurse the patient's most recent platelet count. Confirmed with patient that they are not currently taking any anticoagulation, have any bleeding history or any family history of bleeding disorders. Patient expressed understanding and wished to proceed. All questions were answered. Sterile technique was used throughout the entire procedure. Please see nursing notes for vital signs. Test dose was given through epidural catheter and negative prior to continuing to dose epidural or start infusion. Warning signs of high block given to the patient including shortness of breath, tingling/numbness in hands, complete motor block, or any concerning symptoms with instructions to call for help. Patient was given instructions on  fall risk and not to get out of bed. All questions and concerns addressed with instructions to call with any issues or inadequate analgesia.    Reason for block:procedure for pain

## 2016-08-31 NOTE — H&P (Signed)
Sherry Palmer is a 40 y.o. female presenting in early labor with SROM moderate meconium fluid. (+) GBS. PMH notable for chronic HTN on labetalol, Class B DM, SS trait , hypothyroidism. Uncomplicated PNC. OB History    Gravida Para Term Preterm AB Living   1             SAB TAB Ectopic Multiple Live Births                 Past Medical History:  Diagnosis Date  . Diabetes mellitus without complication ([place])    type 2  . Hypertension   . Hypothyroidism   . Obesity   . Vaginal Pap smear, abnormal    Past Surgical History:  Procedure Laterality Date  . BUNIONECTOMY  2009  . CHOLECYSTECTOMY    . COLPOSCOPY     Family History: family history includes Cancer in her father, maternal aunt, and maternal grandmother; Diabetes in her mother and paternal aunt; Gout in her mother; Hyperlipidemia in her mother; Hypertension in her father and mother; Hypothyroidism in her mother. Social History:  reports that she has never smoked. She has never used smokeless tobacco. Her alcohol and drug histories are not on file.     Maternal Diabetes: Yes:  Diabetes Type:  Insulin/Medication controlled Genetic Screening: Normal Maternal Ultrasounds/Referrals: Abnormal:  Findings:   Isolated EIF (echogenic intracardiac focus) Fetal Ultrasounds or other Referrals:  Fetal echo nl Maternal Substance Abuse:  No Significant Maternal Medications:  Meds include: Syntroid Other: metformin, levimer, labetalol, novolog, macrobid( recent UTI) Significant Maternal Lab Results:  Lab values include: Group B Strep positive Other Comments:  SS trait, chronic HTN , class B DM, hypothyroidism, 1st trim exposure to ACE inibitor, recent UTI  Review of Systems  All other systems reviewed and are negative.  Maternal Medical History:  Reason for admission: Rupture of membranes and contractions.   Contractions: Frequency: irregular.    Fetal activity: Perceived fetal activity is normal.    Prenatal complications: PIH.    SS trait Hypothyroidism 1st trim exposure to ACE inhibitor  Prenatal Complications - Diabetes: type 2. Diabetes is managed by insulin injections and oral agent (monotherapy).      Dilation: 3.5 Effacement (%): 70 Station: -2 Exam by:: [email or web address],rnc Blood pressure 138/83, pulse 92, temperature 99 F (37.2 C), temperature source Oral, resp. rate 17, height 5\' 7"  (1.702 m), weight 108.4 kg (239 lb), last menstrual period 11/28/2015, SpO2 98 %. Maternal Exam:  Uterine Assessment: Contraction frequency is irregular.   Abdomen: Patient reports no abdominal tenderness. Fetal presentation: vertex  Introitus: Normal vulva. Amniotic fluid character: meconium stained.  Cervix: Cervix evaluated by digital exam.     Physical Exam  Constitutional: She is oriented to person, place, and time. She appears well-developed and well-nourished.  HENT:  Head: Atraumatic.  Eyes: EOM are normal.  Neck: Neck supple.  Cardiovascular: Regular rhythm.   Respiratory: Effort normal and breath sounds normal.  GI: Soft.  Musculoskeletal: She exhibits no edema.  Neurological: She is alert and oriented to person, place, and time.  Skin: Skin is warm and dry.  Psychiatric: She has a normal mood and affect.    Prenatal labs: ABO, Rh: O/Positive/-- (06/08 0000) Antibody: Negative (06/08 0000) Rubella: Immune (06/08 0000) RPR: Nonreactive (06/08 0000)  HBsAg: Negative (06/08 0000)  HIV: Non-reactive (06/08 0000)  GBS: Positive (12/21 0000)   Assessment/Plan: SROM GBS cx (+) PCN allergic clindamycin resistant IUP @ 38 2/7 week Chronic HTN on labetalol Class  B DM on insulin/metformin Hypothyroidism Desires sterilization P) admit . Routine labs. Insulin drip. Cont labetalol. CBG q 2hrs. Pitocin augmentation. IV vancomycin. Cont synthroid. Epidural.    Sherry Palmer A 08/31/2016, 2:03 PM

## 2016-08-31 NOTE — MAU Note (Signed)
Pt c/o leaking of fluid starting about 1230 today. Pt states contractions started today at 0800. Pt states baby is moving normally. Pt denies bleeding.

## 2016-08-31 NOTE — Discharge Instructions (Signed)
Introduction Patient Name: ________________________________________________ Patient Due Date: ____________________ What is a fetal movement count? A fetal movement count is the number of times that you feel your baby move during a certain amount of time. This may also be called a fetal kick count. A fetal movement count is recommended for every pregnant woman. You may be asked to start counting fetal movements as early as week 28 of your pregnancy. Pay attention to when your baby is most active. You may notice your baby's sleep and wake cycles. You may also notice things that make your baby move more. You should do a fetal movement count:  When your baby is normally most active.  At the same time each day. A good time to count movements is while you are resting, after having something to eat and drink. How do I count fetal movements? 1. Find a quiet, comfortable area. Sit, or lie down on your side. 2. Write down the date, the start time and stop time, and the number of movements that you felt between those two times. Take this information with you to your health care visits. 3. For 2 hours, count kicks, flutters, swishes, rolls, and jabs. You should feel at least 10 movements during 2 hours. 4. You may stop counting after you have felt 10 movements. 5. If you do not feel 10 movements in 2 hours, have something to eat and drink. Then, keep resting and counting for 1 hour. If you feel at least 4 movements during that hour, you may stop counting. Contact a health care provider if:  You feel fewer than 4 movements in 2 hours.  Your baby is not moving like he or she usually does. Date: ____________ Start time: ____________ Stop time: ____________ Movements: ____________ Date: ____________ Start time: ____________ Stop time: ____________ Movements: ____________ Date: ____________ Start time: ____________ Stop time: ____________ Movements: ____________ Date: ____________ Start time: ____________  Stop time: ____________ Movements: ____________ Date: ____________ Start time: ____________ Stop time: ____________ Movements: ____________ Date: ____________ Start time: ____________ Stop time: ____________ Movements: ____________ Date: ____________ Start time: ____________ Stop time: ____________ Movements: ____________ Date: ____________ Start time: ____________ Stop time: ____________ Movements: ____________ Date: ____________ Start time: ____________ Stop time: ____________ Movements: ____________ This information is not intended to replace advice given to you by your health care provider. Make sure you discuss any questions you have with your health care provider. Document Released: 09/17/2006 Document Revised: 04/16/2016 Document Reviewed: 09/27/2015 Elsevier Interactive Patient Education  2017 [place] [employer] The third trimester is from week 29 through week 40 (months 7 through 9). The third trimester is a time when the unborn baby (fetus) is growing rapidly. At the end of the ninth month, the fetus is about 20 inches in length and weighs 6-10 pounds. Body changes during your third trimester Your body goes through many changes during pregnancy. The changes vary from woman to woman. During the third trimester:  Your weight will continue to increase. You can expect to gain 25-35 pounds (11-16 kg) by the end of the pregnancy.  You may begin to get stretch marks on your hips, abdomen, and breasts.  You may urinate more often because the fetus is moving lower into your pelvis and pressing on your bladder.  You may develop or continue to have heartburn. This is caused by increased hormones that slow down muscles in the digestive tract.  You may develop or continue to have constipation because increased hormones slow digestion and cause the  muscles that push waste through your intestines to relax.  You may develop hemorrhoids. These are swollen veins (varicose  veins) in the rectum that can itch or be painful.  You may develop swollen, bulging veins (varicose veins) in your legs.  You may have increased body aches in the pelvis, back, or thighs. This is due to weight gain and increased hormones that are relaxing your joints.  You may have changes in your hair. These can include thickening of your hair, rapid growth, and changes in texture. Some women also have hair loss during or after pregnancy, or hair that feels dry or thin. Your hair will most likely return to normal after your baby is born.  Your breasts will continue to grow and they will continue to become tender. A yellow fluid (colostrum) may leak from your breasts. This is the first milk you are producing for your baby.  Your belly button may stick out.  You may notice more swelling in your hands, face, or ankles.  You may have increased tingling or numbness in your hands, arms, and legs. The skin on your belly may also feel numb.  You may feel short of breath because of your expanding uterus.  You may have more problems sleeping. This can be caused by the size of your belly, increased need to urinate, and an increase in your body's metabolism.  You may notice the fetus "dropping," or moving lower in your abdomen.  You may have increased vaginal discharge.  Your cervix becomes thin and soft (effaced) near your due date. What to expect at prenatal visits You will have prenatal exams every 2 weeks until week 36. Then you will have weekly prenatal exams. During a routine prenatal visit:  You will be weighed to make sure you and the fetus are growing normally.  Your blood pressure will be taken.  Your abdomen will be measured to track your baby's growth.  The fetal heartbeat will be listened to.  Any test results from the previous visit will be discussed.  You may have a cervical check near your due date to see if you have effaced. At around 36 weeks, your health care provider  will check your cervix. At the same time, your health care provider will also perform a test on the secretions of the vaginal tissue. This test is to determine if a type of bacteria, Group B streptococcus, is present. Your health care provider will explain this further. Your health care provider may ask you:  What your birth plan is.  How you are feeling.  If you are feeling the baby move.  If you have had any abnormal symptoms, such as leaking fluid, bleeding, severe headaches, or abdominal cramping.  If you are using any tobacco products, including cigarettes, chewing tobacco, and electronic cigarettes.  If you have any questions. Other tests or screenings that may be performed during your third trimester include:  Blood tests that check for low iron levels (anemia).  Fetal testing to check the health, activity level, and growth of the fetus. Testing is done if you have certain medical conditions or if there are problems during the pregnancy.  Nonstress test (NST). This test checks the health of your baby to make sure there are no signs of problems, such as the baby not getting enough oxygen. During this test, a belt is placed around your belly. The baby is made to move, and its heart rate is monitored during movement. What is false labor? False labor  is a condition in which you feel small, irregular tightenings of the muscles in the womb (contractions) that eventually go away. These are called Braxton Hicks contractions. Contractions may last for hours, days, or even weeks before true labor sets in. If contractions come at regular intervals, become more frequent, increase in intensity, or become painful, you should see your health care provider. What are the signs of labor?  Abdominal cramps.  Regular contractions that start at 10 minutes apart and become stronger and more frequent with time.  Contractions that start on the top of the uterus and spread down to the lower abdomen and  back.  Increased pelvic pressure and dull back pain.  A watery or bloody mucus discharge that comes from the vagina.  Leaking of amniotic fluid. This is also known as your "water breaking." It could be a slow trickle or a gush. Let your doctor know if it has a color or strange odor. If you have any of these signs, call your health care provider right away, even if it is before your due date. Follow these instructions at home: Eating and drinking  Continue to eat regular, healthy meals.  Do not eat:  Raw meat or meat spreads.  Unpasteurized milk or cheese.  Unpasteurized juice.  Store-made salad.  Refrigerated smoked seafood.  Hot dogs or deli meat, unless they are piping hot.  More than 6 ounces of albacore tuna a week.  Shark, swordfish, king mackerel, or tile fish.  Store-made salads.  Raw sprouts, such as mung bean or alfalfa sprouts.  Take prenatal vitamins as told by your health care provider.  Take 1000 mg of calcium daily as told by your health care provider.  If you develop constipation:  Take over-the-counter or prescription medicines.  Drink enough fluid to keep your urine clear or pale yellow.  Eat foods that are high in fiber, such as fresh fruits and vegetables, whole grains, and beans.  Limit foods that are high in fat and processed sugars, such as fried and sweet foods. Activity  Exercise only as directed by your health care provider. Healthy pregnant women should aim for 2 hours and 30 minutes of moderate exercise per week. If you experience any pain or discomfort while exercising, stop.  Avoid heavy lifting.  Do not exercise in extreme heat or humidity, or at high altitudes.  Wear low-heel, comfortable shoes.  Practice good posture.  Do not travel far distances unless it is absolutely necessary and only with the approval of your health care provider.  Wear your seat belt at all times while in a car, on a bus, or on a plane.  Take  frequent breaks and rest with your legs elevated if you have leg cramps or low back pain.  Do not use hot tubs, steam rooms, or saunas.  You may continue to have sex unless your health care provider tells you otherwise. Lifestyle  Do not use any products that contain nicotine or tobacco, such as cigarettes and e-cigarettes. If you need help quitting, ask your health care provider.  Do not drink alcohol.  Do not use any medicinal herbs or unprescribed drugs. These chemicals affect the formation and growth of the baby.  If you develop varicose veins:  Wear support pantyhose or compression stockings as told by your healthcare provider.  Elevate your feet for 15 minutes, 3-4 times a day.  Wear a supportive maternity bra to help with breast tenderness. General instructions  Take over-the-counter and prescription medicines only  as told by your health care provider. There are medicines that are either safe or unsafe to take during pregnancy.  Take warm sitz baths to soothe any pain or discomfort caused by hemorrhoids. Use hemorrhoid cream or witch hazel if your health care provider approves.  Avoid cat litter boxes and soil used by cats. These carry germs that can cause birth defects in the baby. If you have a cat, ask someone to clean the litter box for you.  To prepare for the arrival of your baby:  Take prenatal classes to understand, practice, and ask questions about the labor and delivery.  Make a trial run to the hospital.  Visit the hospital and tour the maternity area.  Arrange for maternity or paternity leave through employers.  Arrange for family and friends to take care of pets while you are in the hospital.  Purchase a rear-facing car seat and make sure you know how to install it in your car.  Pack your hospital bag.  Prepare the babys nursery. Make sure to remove all pillows and stuffed animals from the baby's crib to prevent suffocation.  Visit your dentist if  you have not gone during your pregnancy. Use a soft toothbrush to brush your teeth and be gentle when you floss.  Keep all prenatal follow-up visits as told by your health care provider. This is important. Contact a health care provider if:  You are unsure if you are in labor or if your water has broken.  You become dizzy.  You have mild pelvic cramps, pelvic pressure, or nagging pain in your abdominal area.  You have lower back pain.  You have persistent nausea, vomiting, or diarrhea.  You have an unusual or bad smelling vaginal discharge.  You have pain when you urinate. Get help right away if:  You have a fever.  You are leaking fluid from your vagina.  You have spotting or bleeding from your vagina.  You have severe abdominal pain or cramping.  You have rapid weight loss or weight gain.  You have shortness of breath with chest pain.  You notice sudden or extreme swelling of your face, hands, ankles, feet, or legs.  Your baby makes fewer than 10 movements in 2 hours.  You have severe headaches that do not go away with medicine.  You have vision changes. Summary  The third trimester is from week 29 through week 40, months 7 through 9. The third trimester is a time when the unborn baby (fetus) is growing rapidly.  During the third trimester, your discomfort may increase as you and your baby continue to gain weight. You may have abdominal, leg, and back pain, sleeping problems, and an increased need to urinate.  During the third trimester your breasts will keep growing and they will continue to become tender. A yellow fluid (colostrum) may leak from your breasts. This is the first milk you are producing for your baby.  False labor is a condition in which you feel small, irregular tightenings of the muscles in the womb (contractions) that eventually go away. These are called Braxton Hicks contractions. Contractions may last for hours, days, or even weeks before true  labor sets in.  Signs of labor can include: abdominal cramps; regular contractions that start at 10 minutes apart and become stronger and more frequent with time; watery or bloody mucus discharge that comes from the vagina; increased pelvic pressure and dull back pain; and leaking of amniotic fluid. This information is not intended to  replace advice given to you by your health care provider. Make sure you discuss any questions you have with your health care provider. Document Released: 08/12/2001 Document Revised: 01/24/2016 Document Reviewed: 10/19/2012 Elsevier Interactive Patient Education  2017 [place]. Vaginal Delivery Vaginal delivery means that you will give birth by pushing your baby out of your birth canal (vagina). A team of health care providers will help you before, during, and after vaginal delivery. Birth experiences are unique for every woman and every pregnancy, and birth experiences vary depending on where you choose to give birth. What should I do to prepare for my baby's birth? Before your baby is born, it is important to talk with your health care provider about:  Your labor and delivery preferences. These may include:  Medicines that you may be given.  How you will manage your pain. This might include non-medical pain relief techniques or injectable pain relief such as epidural analgesia.  How you and your baby will be monitored during labor and delivery.  Who may be in the labor and delivery room with you.  Your feelings about surgical delivery of your baby (cesarean delivery, or C-section) if this becomes necessary.  Your feelings about receiving donated blood through an IV tube (blood transfusion) if this becomes necessary.  Whether you are able:  To take pictures or videos of the birth.  To eat during labor and delivery.  To move around, walk, or change positions during labor and delivery.  What to expect after your baby is born, such as:  Whether  delayed umbilical cord clamping and cutting is offered.  Who will care for your baby right after birth.  Medicines or tests that may be recommended for your baby.  Whether breastfeeding is supported in your hospital or birth center.  How long you will be in the hospital or birth center.  How any medical conditions you have may affect your baby or your labor and delivery experience. To prepare for your baby's birth, you should also:  Attend all of your health care visits before delivery (prenatal visits) as recommended by your health care provider. This is important.  Prepare your home for your baby's arrival. Make sure that you have:  Diapers.  Baby clothing.  Feeding equipment.  Safe sleeping arrangements for you and your baby.  Install a car seat in your vehicle. Have your car seat checked by a certified car seat installer to make sure that it is installed safely.  Think about who will help you with your new baby at home for at least the first several weeks after delivery. What can I expect when I arrive at the birth center or hospital? Once you are in labor and have been admitted into the hospital or birth center, your health care provider may:  Review your pregnancy history and any concerns you have.  Insert an IV tube into one of your veins. This is used to give you fluids and medicines.  Check your blood pressure, pulse, temperature, and heart rate (vital signs).  Check whether your bag of water (amniotic sac) has broken (ruptured).  Talk with you about your birth plan and discuss pain control options. Monitoring Your health care provider may monitor your contractions (uterine monitoring) and your baby's heart rate (fetal monitoring). You may need to be monitored:  Often, but not continuously (intermittently).  All the time or for long periods at a time (continuously). Continuous monitoring may be needed if:  You are taking certain medicines, such  as medicine to  relieve pain or make your contractions stronger.  You have pregnancy or labor complications. Monitoring may be done by:  Placing a special stethoscope or a handheld monitoring device on your abdomen to check your baby's heartbeat, and feeling your abdomen for contractions. This method of monitoring does not continuously record your baby's heartbeat or your contractions.  Placing monitors on your abdomen (external monitors) to record your baby's heartbeat and the frequency and length of contractions. You may not have to wear external monitors all the time.  Placing monitors inside of your uterus (internal monitors) to record your baby's heartbeat and the frequency, length, and strength of your contractions.  Your health care provider may use internal monitors if he or she needs more information about the strength of your contractions or your baby's heart rate.  Internal monitors are put in place by passing a thin, flexible wire through your vagina and into your uterus. Depending on the type of monitor, it may remain in your uterus or on your baby's head until birth.  Your health care provider will discuss the benefits and risks of internal monitoring with you and will ask for your permission before inserting the monitors.  Telemetry. This is a type of continuous monitoring that can be done with external or internal monitors. Instead of having to stay in bed, you are able to move around during telemetry. Ask your health care provider if telemetry is an option for you. Physical exam Your health care provider may perform a physical exam. This may include:  Checking whether your baby is positioned:  With the head toward your vagina (head-down). This is most common.  With the head toward the top of your uterus (head-up or breech). If your baby is in a breech position, your health care provider may try to turn your baby to a head-down position so you can deliver vaginally. If it does not seem that  your baby can be born vaginally, your provider may recommend surgery to deliver your baby. In rare cases, you may be able to deliver vaginally if your baby is head-up (breech delivery).  Lying sideways (transverse). Babies that are lying sideways cannot be delivered vaginally.  Checking your cervix to determine:  Whether it is thinning out (effacing).  Whether it is opening up (dilating).  How low your baby has moved into your birth canal. What are the three stages of labor and delivery?   Normal labor and delivery is divided into the following three stages: Stage 1  Stage 1 is the longest stage of labor, and it can last for hours or days. Stage 1 includes:  Early labor. This is when contractions may be irregular, or regular and mild. Generally, early labor contractions are more than 10 minutes apart.  Active labor. This is when contractions get longer, more regular, more frequent, and more intense.  The transition phase. This is when contractions happen very close together, are very intense, and may last longer than during any other part of labor.  Contractions generally feel mild, infrequent, and irregular at first. They get stronger, more frequent (about every 2-3 minutes), and more regular as you progress from early labor through active labor and transition.  Many women progress through stage 1 naturally, but you may need help to continue making progress. If this happens, your health care provider may talk with you about:  Rupturing your amniotic sac if it has not ruptured yet.  Giving you medicine to help make your contractions  stronger and more frequent.  Stage 1 ends when your cervix is completely dilated to 4 inches (10 cm) and completely effaced. This happens at the end of the transition phase. Stage 2  Once your cervix is completely effaced and dilated to 4 inches (10 cm), you may start to feel an urge to push. It is common for the body to naturally take a rest before  feeling the urge to push, especially if you received an epidural or certain other pain medicines. This rest period may last for up to 1-2 hours, depending on your unique labor experience.  During stage 2, contractions are generally less painful, because pushing helps relieve contraction pain. Instead of contraction pain, you may feel stretching and burning pain, especially when the widest part of your baby's head passes through the vaginal opening (crowning).  Your health care provider will closely monitor your pushing progress and your baby's progress through the vagina during stage 2.  Your health care provider may massage the area of skin between your vaginal opening and anus (perineum) or apply warm compresses to your perineum. This helps it stretch as the baby's head starts to crown, which can help prevent perineal tearing.  In some cases, an incision may be made in your perineum (episiotomy) to allow the baby to pass through the vaginal opening. An episiotomy helps to make the opening of the vagina larger to allow more room for the baby to fit through.  It is very important to breathe and focus so your health care provider can control the delivery of your baby's head. Your health care provider may have you decrease the intensity of your pushing, to help prevent perineal tearing.  After delivery of your baby's head, the shoulders and the rest of the body generally deliver very quickly and without difficulty.  Once your baby is delivered, the umbilical cord may be cut right away, or this may be delayed for 1-2 minutes, depending on your baby's health. This may vary among health care providers, hospitals, and birth centers.  If you and your baby are healthy enough, your baby may be placed on your chest or abdomen to help maintain the baby's temperature and to help you bond with each other. Some mothers and babies start breastfeeding at this time. Your health care team will dry your baby and help  keep your baby warm during this time.  Your baby may need immediate care if he or she:  Showed signs of distress during labor.  Has a medical condition.  Was born too early (prematurely).  Had a bowel movement before birth (meconium).  Shows signs of difficulty transitioning from being inside the uterus to being outside of the uterus. If you are planning to breastfeed, your health care team will help you begin a feeding. Stage 3  The third stage of labor starts immediately after the birth of your baby and ends after you deliver the placenta. The placenta is an organ that develops during pregnancy to provide oxygen and nutrients to your baby in the womb.  Delivering the placenta may require some pushing, and you may have mild contractions. Breastfeeding can stimulate contractions to help you deliver the placenta.  After the placenta is delivered, your uterus should tighten (contract) and become firm. This helps to stop bleeding in your uterus. To help your uterus contract and to control bleeding, your health care provider may:  Give you medicine by injection, through an IV tube, by mouth, or through your rectum (rectally).  Massage your abdomen or perform a vaginal exam to remove any blood clots that are left in your uterus.  Empty your bladder by placing a thin, flexible tube (catheter) into your bladder.  Encourage you to breastfeed your baby. After labor is over, you and your baby will be monitored closely to ensure that you are both healthy until you are ready to go home. Your health care team will teach you how to care for yourself and your baby. This information is not intended to replace advice given to you by your health care provider. Make sure you discuss any questions you have with your health care provider. Document Released: 05/27/2008 Document Revised: 03/07/2016 Document Reviewed: 09/02/2015 Elsevier Interactive Patient Education  2017 [employer].

## 2016-08-31 NOTE — MAU Note (Signed)
Patient presents with cramping and spotting that started this morning.

## 2016-09-01 ENCOUNTER — Encounter (HOSPITAL_COMMUNITY): Payer: Self-pay

## 2016-09-01 LAB — CBC
HEMATOCRIT: 27.7 % — AB (ref 36.0–46.0)
Hemoglobin: 9.5 g/dL — ABNORMAL LOW (ref 12.0–15.0)
MCH: 24 pg — ABNORMAL LOW (ref 26.0–34.0)
MCHC: 34.3 g/dL (ref 30.0–36.0)
MCV: 69.9 fL — ABNORMAL LOW (ref 78.0–100.0)
Platelets: 231 10*3/uL (ref 150–400)
RBC: 3.96 MIL/uL (ref 3.87–5.11)
RDW: 15.9 % — ABNORMAL HIGH (ref 11.5–15.5)
WBC: 14.3 10*3/uL — ABNORMAL HIGH (ref 4.0–10.5)

## 2016-09-01 LAB — GLUCOSE, CAPILLARY
GLUCOSE-CAPILLARY: 121 mg/dL — AB (ref 65–99)
GLUCOSE-CAPILLARY: 137 mg/dL — AB (ref 65–99)

## 2016-09-01 LAB — RPR: RPR: NONREACTIVE

## 2016-09-01 LAB — GLUCOSE, RANDOM: Glucose, Bld: 118 mg/dL — ABNORMAL HIGH (ref 65–99)

## 2016-09-01 MED ORDER — ONDANSETRON HCL 4 MG/2ML IJ SOLN: 4.0000 mg | INTRAMUSCULAR | Status: DC | PRN

## 2016-09-01 MED ORDER — ACETAMINOPHEN 325 MG PO TABS
650.0000 mg | ORAL_TABLET | ORAL | Status: DC | PRN
  Administered 2016-09-01: 650 mg via ORAL
  Filled 2016-09-01: qty 2

## 2016-09-01 MED ORDER — DIPHENHYDRAMINE HCL 25 MG PO CAPS: 25.0000 mg | ORAL_CAPSULE | Freq: Four times a day (QID) | ORAL | Status: DC | PRN

## 2016-09-01 MED ORDER — SIMETHICONE 80 MG PO CHEW: 80.0000 mg | CHEWABLE_TABLET | ORAL | Status: DC | PRN

## 2016-09-01 MED ORDER — MISOPROSTOL 200 MCG PO TABS
1000.0000 ug | ORAL_TABLET | Freq: Once | ORAL | Status: AC
  Administered 2016-09-01: 1000 ug via RECTAL

## 2016-09-01 MED ORDER — DIBUCAINE 1 % RE OINT: 1.0000 "application " | TOPICAL_OINTMENT | RECTAL | Status: DC | PRN

## 2016-09-01 MED ORDER — METFORMIN HCL 500 MG PO TABS
1000.0000 mg | ORAL_TABLET | Freq: Two times a day (BID) | ORAL | Status: DC
  Administered 2016-09-01 – 2016-09-02 (×3): 1000 mg via ORAL
  Filled 2016-09-01 (×5): qty 2

## 2016-09-01 MED ORDER — WITCH HAZEL-GLYCERIN EX PADS: 1.0000 "application " | MEDICATED_PAD | CUTANEOUS | Status: DC | PRN

## 2016-09-01 MED ORDER — COCONUT OIL OIL: 1.0000 "application " | TOPICAL_OIL | Status: DC | PRN

## 2016-09-01 MED ORDER — ONDANSETRON HCL 4 MG PO TABS: 4.0000 mg | ORAL_TABLET | ORAL | Status: DC | PRN

## 2016-09-01 MED ORDER — SENNOSIDES-DOCUSATE SODIUM 8.6-50 MG PO TABS
2.0000 | ORAL_TABLET | ORAL | Status: DC
  Administered 2016-09-01: 2 via ORAL
  Filled 2016-09-01: qty 2

## 2016-09-01 MED ORDER — LACTATED RINGERS IV BOLUS (SEPSIS)
500.0000 mL | Freq: Once | INTRAVENOUS | Status: AC
  Administered 2016-09-01: 500 mL via INTRAVENOUS

## 2016-09-01 MED ORDER — FERROUS SULFATE 325 (65 FE) MG PO TABS
325.0000 mg | ORAL_TABLET | Freq: Two times a day (BID) | ORAL | Status: DC
  Administered 2016-09-01: 325 mg via ORAL
  Filled 2016-09-01 (×2): qty 1

## 2016-09-01 MED ORDER — PRENATAL MULTIVITAMIN CH
1.0000 | ORAL_TABLET | Freq: Every day | ORAL | Status: DC
  Administered 2016-09-01 – 2016-09-02 (×2): 1 via ORAL
  Filled 2016-09-01 (×2): qty 1

## 2016-09-01 MED ORDER — MISOPROSTOL 200 MCG PO TABS
ORAL_TABLET | ORAL | Status: AC
  Administered 2016-09-01: 1000 ug
  Filled 2016-09-01: qty 5

## 2016-09-01 MED ORDER — BENZOCAINE-MENTHOL 20-0.5 % EX AERO
1.0000 "application " | INHALATION_SPRAY | CUTANEOUS | Status: DC | PRN
  Administered 2016-09-01: 1 via TOPICAL
  Filled 2016-09-01: qty 56

## 2016-09-01 MED ORDER — ZOLPIDEM TARTRATE 5 MG PO TABS
5.0000 mg | ORAL_TABLET | Freq: Every evening | ORAL | Status: DC | PRN
Start: 2016-09-01 — End: 2016-09-02

## 2016-09-01 MED ORDER — IBUPROFEN 600 MG PO TABS
600.0000 mg | ORAL_TABLET | Freq: Four times a day (QID) | ORAL | Status: DC
  Administered 2016-09-01 – 2016-09-02 (×6): 600 mg via ORAL
  Filled 2016-09-01 (×6): qty 1

## 2016-09-01 NOTE — Progress Notes (Signed)
Patient ID: Sherry Palmer, female   DOB: [birth year], 41 y.o.   MRN: [id number] PPD # 1 SVD  S:  Reports feeling much better since bleeding issues resolved.             Tolerating po/ No nausea or vomiting             Bleeding is light. Had ~800 mL of blood and clots pass through the night. Bleeding resolved after dose of Cytotec and bolus of LR IVFs.             Pain controlled with ibuprofen (OTC)             Up ad lib / ambulatory / voiding without difficulties     Information for the patient's newborn:  Sherry, Palmer [id number]  female    breast feeding  / Circumcision planning   O:  A & O x 3, in no apparent distress              VS:  Vitals:   09/01/16 0330 09/01/16 0440 09/01/16 0735 09/01/16 1108  BP: (!) 63/42 113/76 137/80 138/73  Pulse: 91 98 93 (!) 109  Resp: 18 20    Temp: 97.8 F (36.6 C) 98.7 F (37.1 C)    TempSrc: Oral Oral    SpO2:      Weight:      Height:        LABS:  Recent Labs  08/31/16 1416 09/01/16 0646  WBC 10.0 14.3*  HGB 11.8* 9.5*  HCT 34.9* 27.7*  PLT 286 231    Blood type: O POS (12/31 1416)  Rubella: Immune (06/08 0000)   I&O: I/O last 3 completed shifts: In: -  Out: 1000 [Urine:200; Blood:800]          No intake/output data recorded.    Abdomen: soft, non-tender, non-distended             Fundus: firm, non-tender, U+1  Perineum: Intact, no edema  Lochia: light  Extremities: Trace edema, no calf pain or tenderness    A/P: PPD # 1 41 y.o., G1P1001   Principal Problem:   Postpartum care following vaginal delivery (12/31) Active Problems:   Labor and delivery, indication for care   Normal labor    Doing well - stable status  Routine post partum orders  D/C saline lock x 2   Keep bladder empty / instructed to get up to the BR every 3 hours  Anticipate discharge tomorrow    Laury Deep, M, MSN, CNM 09/01/2016, 8:45 AM

## 2016-09-01 NOTE — Progress Notes (Signed)
Call Dr. Pamala Hurry concerning pt's low BP and dizziness while sitting up and about her passing x2  Moderate size clots. New orders received.

## 2016-09-01 NOTE — Lactation Note (Signed)
This note was copied from a baby's chart. Lactation Consultation Note  Patient Name: Boy Zenda Dunagan [id number] Date: 09/01/2016 Reason for consult: Initial assessment (baby spitty per Dr, and when [facility] present , STS on moms chest , and will check back , giving mucous a chance to clear ) Baby is 84 hours old and has only had attempts with latch. Per [facility]BU RN - Janyth Pupa - mom has challenge tissue and may need a NS.  LC into see dyad, Pedis just finishing, also per mom baby recently had a bath. LC felt it would be benefit baby to lay on moms chest STS up right to clear  Secretions.  Baby has stools X 2 and LC mentioned to mom the baby having stools will decrease spitting.  LC had to use the bulb syringe for clear secretions , and burping. Per mom feels comfortable with the use of the bulb and where the emergency light is.  LC will re- visit mom.   Maternal Data    Feeding Feeding Type: Breast Fed  LATCH Score/Interventions                Intervention(s): Breastfeeding basics reviewed     Lactation Tools Discussed/Used     Consult Status Consult Status: Follow-up Date: 09/01/16 Follow-up type: In-patient    [place] 09/01/2016, 1:09 PM

## 2016-09-01 NOTE — Lactation Note (Signed)
This note was copied from a baby's chart. Lactation Consultation Note  Patient Name: Sherry Palmer [id number] Date: 09/01/2016 Reason for consult: Difficult latch  "Sherry Palmer" was put to the breast, but fell asleep. Mom's nipple was noted to become more erect when Sherry Palmer was put near the breast. Of concern, is that Mom is a P1 who did not experience any + breast changes w/pregnancy. She also has a number of risk factors that could delay her milk coming to volume: obesity, DM, chronic HTN, AMA, & hypothyroidism (all of these were pre-existing prior to pregnancy). I had Mom pump w/a DEBP on the preemie setting, but nothing was expressed. I showed her hand expression & only the tiniest drops were noted. Sherry Palmer is about 20 hours old and has not yet fed. He began to cry (which sounded like hunger). In light of the probable lack of volume available in mom's breast, she agreed to have Sherry Palmer supplemented w/Alimentum via a Foley cup. He did so with ease. I then placed him upright & skin-to-skin on Mom to see if that would decrease any likelihood of spitting.   I will return later this evening. Sherry Palmer [facility] 09/01/2016, 8:04 PM

## 2016-09-01 NOTE — Progress Notes (Signed)
Post Partum Day 1 S/P spontaneous vaginal  Pre-gestational DM and htn  Subjective: No HA, SOB, CP, F/C, breast symptoms. Normal vaginal bleeding, no clots at this time. Pt with bleeding and clots overnight, dizzy/ vagal episode with getting up to void. Responded to IV fluid bolus. Rectal cytotec given. Pt notes mild uterine tenderness.  Pain controlled. Ambulating without symptoms.  voiding without difficulty    Objective: BP 138/73 (BP Location: Right Arm)   Pulse (!) 109   Temp 98.7 F (37.1 C) (Oral)   Resp 20   Ht 5\' 7"  (1.702 m)   Wt 108.4 kg (239 lb)   LMP 11/28/2015   SpO2 99%   Breastfeeding? Unknown   BMI 37.43 kg/m  I&O reviewed.   Vitals:   09/01/16 0330 09/01/16 0440 09/01/16 0735 09/01/16 1108  BP: (!) 63/42 113/76 137/80 138/73  Pulse: 91 98 93 (!) 109  Resp: 18 20    Temp: 97.8 F (36.6 C) 98.7 F (37.1 C)    TempSrc: Oral Oral    SpO2:      Weight:      Height:         Physical Exam:  General: alert, cooperative and no distress Lochia: appropriate Uterine Fundus: firm, at umbilicus, mild tenderness DVT Evaluation: No evidence of DVT seen on physical exam. Ext: No c/c/e  Recent Labs  08/31/16 1416 09/01/16 0646  HGB 11.8* 9.5*  HCT 34.9* 27.7*      Assessment/Plan: 41 y.o.  PPD #1 .  normal postpartum exam Continue current postpartum care Ambulate Chronic htn, on low dose labetalol. Will watch closely through [place] stay, may need to increase or to switch to procardia. Pt on Losartan prior to pregnancy. Will f/u with PCP for long term management. Pre-gestational DM. On metformin alone at this time, was on NPH bid and regular with meals during pregnancy. Will watch BS, may add back NPH. Anemia. SS trait, Small PPH controlled with cytotec. Plan iron at d/c- pt has at home.    LOS: 1 day   Lisaanne Lawrie A. 09/01/2016 11:34 AM

## 2016-09-01 NOTE — Lactation Note (Signed)
This note was copied from a baby's chart. Lactation Consultation Note  Patient Name: Sherry Palmer [id number] Date: 09/01/2016 Reason for consult: Difficult latch  "[name]" was again put to breast, but he did not show an interest, despite hand expressing tiny dots of colostrum onto mom's nipple. With Mom's permission, I attempted to cup-feed him Alimentum again, but he acted like he wanted to fall asleep. We attempted to bottle-feed, instead, since it had been greater than 3 hrs since he had last fed. However, he did not take to the bottle teat, acting gaggy, & then fell straight to sleep. Dad was instructed to attempt the bottle-feeding again within 1 hour.   I did share my concerns with Mom that her milk may not come to volume in a timely manner due to her risk factors. I asked Mom if she were willing to pump q3h in an attempt to bring her milk in quicker/increase her potential for a better supply and she agreed. I assisted Mom w/using the pump for the 2nd time.   Mom does have a DEBP at home (obtained through insurance company)  Matthias Hughs [facility] 09/01/2016, 8:58 PM

## 2016-09-01 NOTE — Anesthesia Postprocedure Evaluation (Signed)
Anesthesia Post Note  Patient: Sherry Palmer  Procedure(s) Performed: * No procedures listed *  Patient location during evaluation: Mother Baby Anesthesia Type: Epidural Level of consciousness: awake Pain management: satisfactory to patient Vital Signs Assessment: post-procedure vital signs reviewed and stable Respiratory status: spontaneous breathing Cardiovascular status: stable Anesthetic complications: no        Last Vitals:  Vitals:   09/01/16 0735 09/01/16 1108  BP: 137/80 138/73  Pulse: 93 (!) 109  Resp:    Temp:      Last Pain:  Vitals:   09/01/16 0900  TempSrc:   PainSc: 0-No pain   Pain Goal: Patients Stated Pain Goal: 0 (08/31/16 1328)               Casimer Lanius

## 2016-09-02 DIAGNOSIS — E119 Type 2 diabetes mellitus without complications: Secondary | ICD-10-CM

## 2016-09-02 DIAGNOSIS — O10919 Unspecified pre-existing hypertension complicating pregnancy, unspecified trimester: Secondary | ICD-10-CM | POA: Diagnosis present

## 2016-09-02 DIAGNOSIS — E039 Hypothyroidism, unspecified: Secondary | ICD-10-CM | POA: Diagnosis present

## 2016-09-02 DIAGNOSIS — D62 Acute posthemorrhagic anemia: Secondary | ICD-10-CM | POA: Diagnosis not present

## 2016-09-02 MED ORDER — BENZOCAINE-MENTHOL 20-0.5 % EX AERO: 1.0000 "application " | INHALATION_SPRAY | CUTANEOUS | Status: DC | PRN

## 2016-09-02 MED ORDER — IBUPROFEN 600 MG PO TABS: 600.0000 mg | ORAL_TABLET | Freq: Four times a day (QID) | ORAL | 0 refills | Status: DC

## 2016-09-02 MED ORDER — COCONUT OIL OIL: 1.0000 "application " | TOPICAL_OIL | 0 refills | Status: DC | PRN

## 2016-09-02 NOTE — Lactation Note (Signed)
This note was copied from a baby's chart. Lactation Consultation Note  Patient Name: Sherry Palmer [id number] Date: 09/02/2016 Reason for consult: Follow-up assessment   With this first time mom and term baby, now 4 hours old. The baby swallowed amniotic fluid with birth, and has been spitty and not hungry. He has been bottle feed, taking 2-5 ml's at a time. He was also circumcised today. Mom encouraged to pump every 3 hours, even if she does not express any milk, and to add hand expression, and feed drops to baby with her finger. Mom also advised to feed baby at lest every 3 hours, and not allow him to sleep longer than 3 hours. If baby just taking 2-3 ml's, try feeding every 2 hours, if up to 9-10 ml's, every 3 hours , or with cues more frequently.  Mom and dad agreed to me showing them how to position baby for feeding, in side lying, and how to offer gentle chin and cheek supplort to stimulate sucking. [name] was awake and alert, unwrapped with just diaper and t-shirt, and took a total of 9 ml's of formula in about 30 minutes. He tolerated this well, and was given to mom to hold upright after feeding. Mom knows to call for questions/conerns.    Maternal Data    Feeding Feeding Type: Bottle Fed - Formula Nipple Type: Slow - flow Length of feed: 30 min  LATCH Score/Interventions                      Lactation Tools Discussed/Used     Consult Status Consult Status: Follow-up Date: 09/03/16 Follow-up type: In-patient    [name] 09/02/2016, 1:15 PM

## 2016-09-02 NOTE — Discharge Summary (Signed)
Obstetric Discharge Summary Reason for Admission: onset of labor and rupture of membranes Prenatal Procedures: ultrasound Intrapartum Procedures: spontaneous vaginal delivery, GBS prophylaxis and epidural Postpartum Procedures: none Complications-Operative and Postpartum: hemorrhage Hemoglobin  Date Value Ref Range Status  09/01/2016 9.5 (L) 12.0 - 15.0 g/dL Final   HCT  Date Value Ref Range Status  09/01/2016 27.7 (L) 36.0 - 46.0 % Final    Physical Exam:  General: alert, cooperative and no distress Lochia: appropriate Uterine Fundus: firm Incision: none DVT Evaluation: No cords or calf tenderness. No significant calf/ankle edema.  Discharge Diagnoses: Term Pregnancy-delivered and anemia, chronic hypertension, hypothyroidism, Class B DM  Discharge Information: Date: 09/02/2016 Activity: pelvic rest Diet: routine Medications: PNV, Ibuprofen, Iron and synthroid, metformin, labetalol Condition: stable Instructions: refer to practice specific booklet Discharge to: boarding status while newborn remains inpatient Follow-up Information    COUSINS,SHERONETTE A, MD. Schedule an appointment as soon as possible for a visit in 6 week(s).   Specialty:  Obstetrics and Gynecology Contact information: [address] [place] [address] [phone]           Newborn Data: Live born female  Birth Weight: 6 lb 2.2 oz (2784 g) APGAR: 8, 9  Remains inpatient for poor feeding.  Sherry Palmer 09/02/2016, 9:25 AM

## 2016-09-02 NOTE — Progress Notes (Signed)
Post Partum Day #2           Information for the patient's newborn:  Floriene, Cress [id number]  female  circumcision completed  Feeding: breast and bottle  Subjective: No HA, SOB, CP, F/C, breast symptoms. Pain minimal. Normal vaginal bleeding, no clots.      Objective:  VS:  Vitals:   09/01/16 1108 09/01/16 1900 09/01/16 2349 09/02/16 0532  BP: 138/73 101/66 128/70 118/62  Pulse: (!) 109 96 84 84  Resp:  18  18  Temp:  98.2 F (36.8 C)  97.9 F (36.6 C)  TempSrc:  Oral  Oral  SpO2:  98%    Weight:      Height:           Recent Labs  08/31/16 1416 09/01/16 0646  WBC 10.0 14.3*  HGB 11.8* 9.5*  HCT 34.9* 27.7*  PLT 286 231   CBG (last 3)   Recent Labs  08/31/16 2032 09/01/16 1202 09/01/16 2129  GLUCAP 112* 121* 137*     Blood type: --/--/O POS, O POS (12/31 1416) Rubella: Immune (06/08 0000)    Physical Exam:  General: alert, cooperative and no distress Uterine Fundus: firm Lochia: appropriate Perineum:  edema minimal DVT Evaluation: No cords or calf tenderness. No significant calf/ankle edema.    Assessment/Plan: PPD # 2 / 41 y.o., G1P1001 S/P:spontaneous vaginal   Principal Problem:   Postpartum care following vaginal delivery (12/31) Active Problems:   Diabetes mellitus type 2, controlled ([place])   Chronic hypertension in obstetric context   Acute blood loss anemia - PPH 800 cc   Hypothyroidism    normal postpartum exam  Continue current postpartum care  Start Oral Fe at discharge, continue 4-56 wks PP  Continue Metformin as established, f/u w/ Dr. Chalmers Cater for DM management after postpartum  Continue Synthroid as established             DC home to boarding status w/ instructions  F/U at [place] in 6 weeks and PRN   LOS: 2 days   Juliene Pina, CNM, MSN 09/02/2016, 8:40 AM

## 2016-09-03 ENCOUNTER — Ambulatory Visit: Payer: Self-pay

## 2016-09-03 NOTE — Lactation Note (Signed)
This note was copied from a baby's chart. Lactation Consultation Note  Patient Name: Sherry Palmer [id number] Date: 09/03/2016   Visited with Mom on day of discharge, baby 35 hrs old.  Mom continuing to pump regularly using the Symphony pump, obtaining "moisture" when pumping.  Mom aware of her risk factors contributing to delay/decrease in milk supply.  Baby doing a bit better with his bottle feeding, last volume 25 ml Neosure, feeding every 2 hrs.  Assisted FOB in holding baby more upright holding baby's head.  Showed how to direct nipple towards baby's palate to help to stimulate suck, along with gently twirling nipple.  Encouraged frequent burping including prior to start of feeding.    Plan- -STS, breast massage, warm compresses, hand expression -Pump both breasts 15-20 mins -Feed baby 25-30 ml Neosure formula every 2-3 hrs (increasing as tolerated) -Follow-up with Lactation with OP appt 09/09/16 - Call Lactation prn for concerns     Maternal Data    Feeding Feeding Type: Bottle Fed - Formula Nipple Type: Regular Length of feed: 40 min  LATCH Score/Interventions                      Lactation Tools Discussed/Used     Consult Status      Sherry Palmer 09/03/2016, 9:14 AM

## 2016-09-05 ENCOUNTER — Inpatient Hospital Stay (HOSPITAL_COMMUNITY): Admission: RE | Admit: 2016-09-05 | Payer: 59 | Source: Ambulatory Visit

## 2016-09-09 ENCOUNTER — Ambulatory Visit (HOSPITAL_COMMUNITY): Payer: 59

## 2016-10-14 DIAGNOSIS — N870 Mild cervical dysplasia: Secondary | ICD-10-CM | POA: Diagnosis not present

## 2016-10-14 DIAGNOSIS — R87610 Atypical squamous cells of undetermined significance on cytologic smear of cervix (ASC-US): Secondary | ICD-10-CM | POA: Diagnosis not present

## 2016-10-20 DIAGNOSIS — I10 Essential (primary) hypertension: Secondary | ICD-10-CM | POA: Diagnosis not present

## 2016-10-20 DIAGNOSIS — E039 Hypothyroidism, unspecified: Secondary | ICD-10-CM | POA: Diagnosis not present

## 2016-10-20 DIAGNOSIS — E1165 Type 2 diabetes mellitus with hyperglycemia: Secondary | ICD-10-CM | POA: Diagnosis not present

## 2016-10-30 DIAGNOSIS — R87610 Atypical squamous cells of undetermined significance on cytologic smear of cervix (ASC-US): Secondary | ICD-10-CM | POA: Diagnosis not present

## 2016-10-30 DIAGNOSIS — N871 Moderate cervical dysplasia: Secondary | ICD-10-CM | POA: Diagnosis not present

## 2016-12-11 DIAGNOSIS — N871 Moderate cervical dysplasia: Secondary | ICD-10-CM | POA: Diagnosis not present

## 2016-12-22 DIAGNOSIS — E119 Type 2 diabetes mellitus without complications: Secondary | ICD-10-CM | POA: Diagnosis not present

## 2016-12-29 DIAGNOSIS — E039 Hypothyroidism, unspecified: Secondary | ICD-10-CM | POA: Diagnosis not present

## 2016-12-29 DIAGNOSIS — E119 Type 2 diabetes mellitus without complications: Secondary | ICD-10-CM | POA: Diagnosis not present

## 2016-12-29 DIAGNOSIS — I10 Essential (primary) hypertension: Secondary | ICD-10-CM | POA: Diagnosis not present

## 2017-01-14 DIAGNOSIS — I10 Essential (primary) hypertension: Secondary | ICD-10-CM | POA: Diagnosis not present

## 2017-02-09 DIAGNOSIS — Z1231 Encounter for screening mammogram for malignant neoplasm of breast: Secondary | ICD-10-CM | POA: Diagnosis not present

## 2017-02-17 DIAGNOSIS — E1165 Type 2 diabetes mellitus with hyperglycemia: Secondary | ICD-10-CM | POA: Diagnosis not present

## 2017-02-17 DIAGNOSIS — E039 Hypothyroidism, unspecified: Secondary | ICD-10-CM | POA: Diagnosis not present

## 2017-02-17 DIAGNOSIS — I10 Essential (primary) hypertension: Secondary | ICD-10-CM | POA: Diagnosis not present

## 2017-02-25 DIAGNOSIS — I10 Essential (primary) hypertension: Secondary | ICD-10-CM | POA: Diagnosis not present

## 2017-05-09 DIAGNOSIS — Z23 Encounter for immunization: Secondary | ICD-10-CM | POA: Diagnosis not present

## 2017-07-20 DIAGNOSIS — J209 Acute bronchitis, unspecified: Secondary | ICD-10-CM | POA: Diagnosis not present

## 2017-09-03 DIAGNOSIS — I10 Essential (primary) hypertension: Secondary | ICD-10-CM | POA: Diagnosis not present

## 2017-09-03 DIAGNOSIS — E039 Hypothyroidism, unspecified: Secondary | ICD-10-CM | POA: Diagnosis not present

## 2017-09-03 DIAGNOSIS — E119 Type 2 diabetes mellitus without complications: Secondary | ICD-10-CM | POA: Diagnosis not present

## 2017-09-17 DIAGNOSIS — E876 Hypokalemia: Secondary | ICD-10-CM | POA: Diagnosis not present

## 2017-10-15 DIAGNOSIS — E039 Hypothyroidism, unspecified: Secondary | ICD-10-CM | POA: Diagnosis not present

## 2017-12-23 DIAGNOSIS — E119 Type 2 diabetes mellitus without complications: Secondary | ICD-10-CM | POA: Diagnosis not present

## 2018-02-01 DIAGNOSIS — N871 Moderate cervical dysplasia: Secondary | ICD-10-CM | POA: Diagnosis not present

## 2018-02-01 DIAGNOSIS — Z01419 Encounter for gynecological examination (general) (routine) without abnormal findings: Secondary | ICD-10-CM | POA: Diagnosis not present

## 2018-02-15 DIAGNOSIS — R42 Dizziness and giddiness: Secondary | ICD-10-CM | POA: Diagnosis not present

## 2018-02-15 DIAGNOSIS — D649 Anemia, unspecified: Secondary | ICD-10-CM | POA: Diagnosis not present

## 2018-02-15 DIAGNOSIS — E039 Hypothyroidism, unspecified: Secondary | ICD-10-CM | POA: Diagnosis not present

## 2018-02-22 DIAGNOSIS — Z30431 Encounter for routine checking of intrauterine contraceptive device: Secondary | ICD-10-CM | POA: Diagnosis not present

## 2018-02-22 DIAGNOSIS — R87612 Low grade squamous intraepithelial lesion on cytologic smear of cervix (LGSIL): Secondary | ICD-10-CM | POA: Diagnosis not present

## 2018-02-22 DIAGNOSIS — N871 Moderate cervical dysplasia: Secondary | ICD-10-CM | POA: Diagnosis not present

## 2018-03-01 DIAGNOSIS — D649 Anemia, unspecified: Secondary | ICD-10-CM | POA: Diagnosis not present

## 2018-04-05 DIAGNOSIS — I10 Essential (primary) hypertension: Secondary | ICD-10-CM | POA: Diagnosis not present

## 2018-04-05 DIAGNOSIS — E119 Type 2 diabetes mellitus without complications: Secondary | ICD-10-CM | POA: Diagnosis not present

## 2018-04-05 DIAGNOSIS — E039 Hypothyroidism, unspecified: Secondary | ICD-10-CM | POA: Diagnosis not present

## 2018-04-29 ENCOUNTER — Ambulatory Visit (INDEPENDENT_AMBULATORY_CARE_PROVIDER_SITE_OTHER): Payer: Self-pay

## 2018-04-29 ENCOUNTER — Encounter (INDEPENDENT_AMBULATORY_CARE_PROVIDER_SITE_OTHER): Payer: 59

## 2018-05-05 ENCOUNTER — Encounter (INDEPENDENT_AMBULATORY_CARE_PROVIDER_SITE_OTHER): Payer: Self-pay | Admitting: Family Medicine

## 2018-05-05 ENCOUNTER — Ambulatory Visit (INDEPENDENT_AMBULATORY_CARE_PROVIDER_SITE_OTHER): Payer: 59 | Admitting: Family Medicine

## 2018-05-05 VITALS — BP 100/67 | HR 81 | Temp 97.6°F | Ht 66.0 in | Wt 217.0 lb

## 2018-05-05 DIAGNOSIS — R0602 Shortness of breath: Secondary | ICD-10-CM

## 2018-05-05 DIAGNOSIS — Z0289 Encounter for other administrative examinations: Secondary | ICD-10-CM

## 2018-05-05 DIAGNOSIS — R5383 Other fatigue: Secondary | ICD-10-CM

## 2018-05-05 DIAGNOSIS — Z9189 Other specified personal risk factors, not elsewhere classified: Secondary | ICD-10-CM

## 2018-05-05 DIAGNOSIS — E119 Type 2 diabetes mellitus without complications: Secondary | ICD-10-CM

## 2018-05-05 DIAGNOSIS — E038 Other specified hypothyroidism: Secondary | ICD-10-CM | POA: Diagnosis not present

## 2018-05-05 DIAGNOSIS — Z1331 Encounter for screening for depression: Secondary | ICD-10-CM | POA: Diagnosis not present

## 2018-05-05 DIAGNOSIS — Z6835 Body mass index (BMI) 35.0-35.9, adult: Secondary | ICD-10-CM

## 2018-05-06 LAB — COMPREHENSIVE METABOLIC PANEL
A/G RATIO: 1.6 (ref 1.2–2.2)
ALBUMIN: 4.4 g/dL (ref 3.5–5.5)
ALT: 16 IU/L (ref 0–32)
AST: 16 IU/L (ref 0–40)
Alkaline Phosphatase: 95 IU/L (ref 39–117)
BILIRUBIN TOTAL: 0.3 mg/dL (ref 0.0–1.2)
BUN / CREAT RATIO: 14 (ref 9–23)
BUN: 11 mg/dL (ref 6–24)
CHLORIDE: 98 mmol/L (ref 96–106)
CO2: 22 mmol/L (ref 20–29)
Calcium: 9.3 mg/dL (ref 8.7–10.2)
Creatinine, Ser: 0.79 mg/dL (ref 0.57–1.00)
GFR calc non Af Amer: 92 mL/min/{1.73_m2} (ref >59)
GFR, EST AFRICAN AMERICAN: 106 mL/min/{1.73_m2} (ref >59)
GLUCOSE: 107 mg/dL — AB (ref 65–99)
Globulin, Total: 2.7 g/dL (ref 1.5–4.5)
POTASSIUM: 3.6 mmol/L (ref 3.5–5.2)
SODIUM: 138 mmol/L (ref 134–144)
Total Protein: 7.1 g/dL (ref 6.0–8.5)

## 2018-05-06 LAB — VITAMIN B12: Vitamin B-12: 362 pg/mL (ref 232–1245)

## 2018-05-06 LAB — CBC WITH DIFFERENTIAL
BASOS ABS: 0.1 10*3/uL (ref 0.0–0.2)
BASOS: 1 %
EOS (ABSOLUTE): 0.4 10*3/uL (ref 0.0–0.4)
EOS: 5 %
HEMOGLOBIN: 11.7 g/dL (ref 11.1–15.9)
Hematocrit: 36.4 % (ref 34.0–46.6)
IMMATURE GRANS (ABS): 0 10*3/uL (ref 0.0–0.1)
Immature Granulocytes: 0 %
Lymphocytes Absolute: 2.1 10*3/uL (ref 0.7–3.1)
Lymphs: 26 %
MCH: 22.2 pg — AB (ref 26.6–33.0)
MCHC: 32.1 g/dL (ref 31.5–35.7)
MCV: 69 fL — AB (ref 79–97)
MONOCYTES: 6 %
Monocytes Absolute: 0.5 10*3/uL (ref 0.1–0.9)
NEUTROS ABS: 5 10*3/uL (ref 1.4–7.0)
Neutrophils: 62 %
RBC: 5.28 x10E6/uL (ref 3.77–5.28)
RDW: 15.9 % — ABNORMAL HIGH (ref 12.3–15.4)
WBC: 8 10*3/uL (ref 3.4–10.8)

## 2018-05-06 LAB — TSH: TSH: 0.988 u[IU]/mL (ref 0.450–4.500)

## 2018-05-06 LAB — MICROALBUMIN / CREATININE URINE RATIO
Creatinine, Urine: 61.8 mg/dL
Microalb/Creat Ratio: 7 mg/g creat (ref 0.0–30.0)
Microalbumin, Urine: 4.3 ug/mL

## 2018-05-06 LAB — LIPID PANEL WITH LDL/HDL RATIO
Cholesterol, Total: 149 mg/dL (ref 100–199)
HDL: 52 mg/dL (ref >39)
LDL CALC: 76 mg/dL (ref 0–99)
LDL/HDL RATIO: 1.5 ratio (ref 0.0–3.2)
Triglycerides: 107 mg/dL (ref 0–149)
VLDL CHOLESTEROL CAL: 21 mg/dL (ref 5–40)

## 2018-05-06 LAB — T3: T3 TOTAL: 97 ng/dL (ref 71–180)

## 2018-05-06 LAB — HEMOGLOBIN A1C
Est. average glucose Bld gHb Est-mCnc: 146 mg/dL
Hgb A1c MFr Bld: 6.7 % — ABNORMAL HIGH (ref 4.8–5.6)

## 2018-05-06 LAB — FOLATE

## 2018-05-06 LAB — T4, FREE: FREE T4: 1.5 ng/dL (ref 0.82–1.77)

## 2018-05-06 LAB — VITAMIN D 25 HYDROXY (VIT D DEFICIENCY, FRACTURES): Vit D, 25-Hydroxy: 20.1 ng/mL — ABNORMAL LOW (ref 30.0–100.0)

## 2018-05-06 LAB — INSULIN, RANDOM: INSULIN: 26.5 u[IU]/mL — ABNORMAL HIGH (ref 2.6–24.9)

## 2018-05-06 NOTE — Progress Notes (Signed)
Office: [phone]  /  Fax: [phone]   Dear Dr. Garwin Brothers,   Thank you for referring Sherry Palmer to our clinic. The following note includes my evaluation and treatment recommendations.  HPI:   Chief Complaint: OBESITY    Sherry Palmer has been referred by Servando Salina, MD for consultation regarding her obesity and obesity related comorbidities.    Sherry Palmer (MR# [id number]) is a 42 y.o. female who presents on 05/05/2018 for obesity evaluation and treatment. Current BMI is Body mass index is 35.02 kg/m.Marland Kitchen [name] has been struggling with her weight for many years and has been unsuccessful in either losing weight, maintaining weight loss, or reaching her healthy weight goal.     Sherry Palmer attended our information session and states she is currently in the action stage of change and ready to dedicate time achieving and maintaining a healthier weight. Sherry Palmer is interested in becoming our patient and working on intensive lifestyle modifications including (but not limited to) diet, exercise and weight loss.    Sherry Palmer states her family eats meals together she thinks her family will eat healthier with  her her desired weight loss is 27-32 lbs she started gaining weight at 42 years old her heaviest weight ever was 230 lbs she has significant food cravings issues  she snacks frequently in the evenings she skips meals frequently she is frequently drinking liquids with calories she frequently makes poor food choices she frequently eats larger portions than normal  she struggles with emotional eating    Fatigue Sherry Palmer feels her energy is lower than it should be. This has worsened with weight gain and has not worsened recently. [name] admits to daytime somnolence and  admits to waking up still tired. Patient is at risk for obstructive sleep apnea. Patent has a history of symptoms of daytime fatigue. Patient generally gets 6 hours of sleep per night, and states they generally have  generally restful sleep. Snoring is present. Apneic episodes are present. Epworth Sleepiness Score is 16.  Dyspnea on exertion Sherry Palmer notes increasing shortness of breath with exercising and seems to be worsening over time with weight gain. She notes getting out of breath sooner with activity than she used to. This has not gotten worse recently. Sherry Palmer denies orthopnea.  Diabetes II Sherry Palmer has a diagnosis of diabetes type II. [name] is on metformin and she states fasting BGs range between 130 and 143, and 2 hour post prandial ranges between 109 and 124. She denies any hypoglycemic episodes. She has been working on intensive lifestyle modifications including diet, exercise, and weight loss to help control her blood glucose levels.  Hypothyroidism Sherry Palmer has a diagnosis of hypothyroidism. Sherry Palmer is on Synthroid 125 mcg daily, and she has no recent labs. She denies hot or cold intolerance or palpitations, but does admit to ongoing fatigue.  At risk for cardiovascular disease Sherry Palmer is at a higher than average risk for cardiovascular disease due to obesity, diabetes II, and hypothyroidism. She currently denies any chest pain.  Depression Screen Sherry Palmer's Food and Mood (modified PHQ-9) score was  Depression screen PHQ 2/9 05/05/2018  Decreased Interest 1  Down, Depressed, Hopeless 1  PHQ - 2 Score 2  Altered sleeping 0  Tired, decreased energy 2  Change in appetite 1  Feeling bad or failure about yourself  0  Trouble concentrating 1  Moving slowly or fidgety/restless 0  Suicidal thoughts 0  PHQ-9 Score 6  Difficult doing work/chores Not difficult at all    ALLERGIES: Allergies  Allergen Reactions  . Amoxicillin Hives and Other (See Comments)    Has patient had a PCN reaction causing immediate rash, facial/tongue/throat swelling, SOB or lightheadedness with hypotension: No Has patient had a PCN reaction causing severe rash involving mucus membranes or skin necrosis: No Has patient had a PCN  reaction that required hospitalization No Has patient had a PCN reaction occurring within the last 10 years: No If all of the above answers are "NO", then may proceed with Cephalosporin use.    MEDICATIONS: Current Outpatient Medications on File Prior to Visit  Medication Sig Dispense Refill  . ferrous sulfate 325 (65 FE) MG tablet Take 325 mg by mouth daily with breakfast.    . folic acid (FOLVITE) 1 MG tablet Take 1 mg by mouth daily.    . hydrochlorothiazide (HYDRODIURIL) 25 MG tablet Take 25 mg by mouth daily.    Marland Kitchen labetalol (NORMODYNE) 100 MG tablet Take 100 mg by mouth once.     Marland Kitchen levothyroxine (LEVOTHROID) 137 MCG tablet Take 137 mcg by mouth daily before breakfast.    . losartan (COZAAR) 100 MG tablet Take 100 mg by mouth daily.    . metFORMIN (GLUCOPHAGE) 500 MG tablet Take 1,000 mg by mouth 2 (two) times daily with a meal.      No current facility-administered medications on file prior to visit.     PAST MEDICAL HISTORY: Past Medical History:  Diagnosis Date  . Anemia   . Back pain   . Diabetes mellitus without complication ([place])    type 2  . Dyspnea   . Gallbladder problem   . Hypertension   . Hypothyroidism   . Joint pain   . Obesity   . Postpartum care following vaginal delivery (12/31) 09/01/2016  . Sleep apnea   . Vaginal Pap smear, abnormal     PAST SURGICAL HISTORY: Past Surgical History:  Procedure Laterality Date  . BUNIONECTOMY  2009  . CHOLECYSTECTOMY    . COLPOSCOPY      SOCIAL HISTORY: Social History   Tobacco Use  . Smoking status: Never Smoker  . Smokeless tobacco: Never Used  Substance Use Topics  . Alcohol use: No  . Drug use: No    FAMILY HISTORY: Family History  Problem Relation Age of Onset  . Hypertension Mother   . Hypothyroidism Mother   . Diabetes Mother   . Hyperlipidemia Mother   . Gout Mother   . Sleep apnea Mother   . Cancer Father        colon  . Hypertension Father   . Cancer Maternal Aunt   . Diabetes Paternal  Aunt   . Cancer Maternal Grandmother     ROS: Review of Systems  Constitutional: Positive for malaise/fatigue. Negative for weight loss.  Eyes:       + Wear glasses or contacts  Respiratory: Positive for shortness of breath.   Cardiovascular: Negative for chest pain and orthopnea.  Musculoskeletal: Positive for back pain.       + Muscle or joint pain    PHYSICAL EXAM: Blood pressure 100/67, pulse 81, temperature 97.6 F (36.4 C), temperature source Oral, height 5\' 6"  (1.676 m), weight 217 lb (98.4 kg), last menstrual period 04/22/2018, SpO2 99 %, unknown if currently breastfeeding. Body mass index is 35.02 kg/m. Physical Exam  Constitutional: She is oriented to person, place, and time. She appears well-developed and well-nourished.  HENT:  Head: Normocephalic and atraumatic.  Nose: Nose normal.  Eyes: EOM are normal. No scleral  icterus.  Neck: Normal range of motion. Neck supple. No thyromegaly present.  Cardiovascular: Normal rate and regular rhythm.  Pulmonary/Chest: Effort normal. No respiratory distress.  Abdominal: Soft. There is no tenderness.  + Obesity  Musculoskeletal:  Range of Motion normal in all 4 extremities Trace edema noted in bilateral lower extremities  Neurological: She is alert and oriented to person, place, and time. Coordination normal.  Skin: Skin is warm and dry.  Psychiatric: She has a normal mood and affect. Her behavior is normal.  Vitals reviewed.   RECENT LABS AND TESTS: BMET    Component Value Date/Time   NA 138 05/05/2018 0914   K 3.6 05/05/2018 0914   CL 98 05/05/2018 0914   CO2 22 05/05/2018 0914   GLUCOSE 107 (H) 05/05/2018 0914   GLUCOSE 118 (H) 09/01/2016 0646   BUN 11 05/05/2018 0914   CREATININE 0.79 05/05/2018 0914   CALCIUM 9.3 05/05/2018 0914   GFRNONAA 92 05/05/2018 0914   GFRAA 106 05/05/2018 0914   Lab Results  Component Value Date   HGBA1C 6.7 (H) 05/05/2018   Lab Results  Component Value Date   INSULIN 26.5  (H) 05/05/2018   CBC    Component Value Date/Time   WBC 8.0 05/05/2018 0914   WBC 14.3 (H) 09/01/2016 0646   RBC 5.28 05/05/2018 0914   RBC 3.96 09/01/2016 0646   HGB 11.7 05/05/2018 0914   HCT 36.4 05/05/2018 0914   PLT 231 09/01/2016 0646   MCV 69 (L) 05/05/2018 0914   MCH 22.2 (L) 05/05/2018 0914   MCH 24.0 (L) 09/01/2016 0646   MCHC 32.1 05/05/2018 0914   MCHC 34.3 09/01/2016 0646   RDW 15.9 (H) 05/05/2018 0914   LYMPHSABS 2.1 05/05/2018 0914   EOSABS 0.4 05/05/2018 0914   BASOSABS 0.1 05/05/2018 0914   Iron/TIBC/Ferritin/ %Sat No results found for: IRON, TIBC, FERRITIN, IRONPCTSAT Lipid Panel     Component Value Date/Time   CHOL 149 05/05/2018 0914   TRIG 107 05/05/2018 0914   HDL 52 05/05/2018 0914   LDLCALC 76 05/05/2018 0914   Hepatic Function Panel     Component Value Date/Time   PROT 7.1 05/05/2018 0914   ALBUMIN 4.4 05/05/2018 0914   AST 16 05/05/2018 0914   ALT 16 05/05/2018 0914   ALKPHOS 95 05/05/2018 0914   BILITOT 0.3 05/05/2018 0914      Component Value Date/Time   TSH 0.988 05/05/2018 0914    ECG  shows NSR with a rate of 82 BPM INDIRECT CALORIMETER done today shows a VO2 of 202 and a REE of 1403.  Her calculated basal metabolic rate is 4332 thus her basal metabolic rate is worse than expected.    ASSESSMENT AND PLAN: Other fatigue - Plan: EKG 12-Lead, Vitamin B12, CBC With Differential, Folate, Lipid Panel With LDL/HDL Ratio, VITAMIN D 25 Hydroxy (Vit-D Deficiency, Fractures)  Shortness of breath on exertion  Type 2 diabetes mellitus without complication, without long-term current use of insulin (HCC) - Plan: Comprehensive metabolic panel, Hemoglobin A1c, Insulin, random, Microalbumin / creatinine urine ratio  Other specified hypothyroidism - Plan: T3, T4, free, TSH  Depression screening  At risk for heart disease  Class 2 severe obesity with serious comorbidity and body mass index (BMI) of 35.0 to 35.9 in adult, unspecified obesity  type (HCC)  PLAN:  Fatigue Sherry Palmer was informed that her fatigue may be related to obesity, depression or many other causes. Labs will be ordered, and in the meanwhile Sherry Palmer has agreed  to work on diet, exercise and weight loss to help with fatigue. Proper sleep hygiene was discussed including the need for 7-8 hours of quality sleep each night. A sleep study was not ordered based on symptoms and Epworth score.  Dyspnea on exertion Amaani's shortness of breath appears to be obesity related and exercise induced. She has agreed to work on weight loss and gradually increase exercise to treat her exercise induced shortness of breath. If Donnamae follows our instructions and loses weight without improvement of her shortness of breath, we will plan to refer to pulmonology. We will monitor this condition regularly. Sherry Palmer agrees to this plan.  Diabetes II Sherry Palmer has been given extensive diabetes education by myself today including ideal fasting and post-prandial blood glucose readings, individual ideal Hgb A1c goals and hypoglycemia prevention. We discussed the importance of good blood sugar control to decrease the likelihood of diabetic complications such as nephropathy, neuropathy, limb loss, blindness, coronary artery disease, and death. We discussed the importance of intensive lifestyle modification including diet, exercise and weight loss as the first line treatment for diabetes. Sherry Palmer agrees to continue taking metformin and she is to check her BGs BID. We will check labs and Leina agrees to follow up with our clinic in 2 weeks.  Hypothyroidism Sherry Palmer was informed of the importance of good thyroid control to help with weight loss efforts. She was also informed that supertheraputic thyroid levels are dangerous and will not improve weight loss results. We will check labs and Sherry Palmer agrees to follow up with our clinic in 2 weeks.  Cardiovascular risk counselling Shya was given extended (15 minutes) coronary artery  disease prevention counseling today. She is 42 y.o. female and has risk factors for heart disease including obesity, diabetes II, and hypothyroidism. We discussed intensive lifestyle modifications today with an emphasis on specific weight loss instructions and strategies. Pt was also informed of the importance of increasing exercise and decreasing saturated fats to help prevent heart disease.  Depression Screen Sherry Palmer had a mildly positive depression screening. Depression is commonly associated with obesity and often results in emotional eating behaviors. We will monitor this closely and work on CBT to help improve the non-hunger eating patterns. Referral to Psychology may be required if no improvement is seen as she continues in our clinic.  Obesity Salvatrice is currently in the action stage of change and her goal is to continue with weight loss efforts. I recommend Sherry Palmer begin the structured treatment plan as follows:  She has agreed to follow the Category 2 plan Ndia has been instructed to eventually work up to a goal of 150 minutes of combined cardio and strengthening exercise per week for weight loss and overall health benefits. We discussed the following Behavioral Modification Strategies today: increasing lean protein intake, decreasing simple carbohydrates  and work on meal planning and easy cooking plans   She was informed of the importance of frequent follow up visits to maximize her success with intensive lifestyle modifications for her multiple health conditions. She was informed we would discuss her lab results at her next visit unless there is a critical issue that needs to be addressed sooner. Eimy agreed to keep her next visit at the agreed upon time to discuss these results.    OBESITY BEHAVIORAL INTERVENTION VISIT  Today's visit was # 1   Starting weight: 217 lbs Starting date: 05/05/18 Today's weight : 217 lbs  Today's date: 05/05/2018 Total lbs lost to date: 0  ASK: We  discussed the diagnosis  of obesity with Sherry Palmer today and Shadoe agreed to give [place] permission to discuss obesity behavioral modification therapy today.  ASSESS: Mariaeduarda has the diagnosis of obesity and her BMI today is 35.04 Jeannemarie is in the action stage of change   ADVISE: Chere was educated on the multiple health risks of obesity as well as the benefit of weight loss to improve her health. She was advised of the need for long term treatment and the importance of lifestyle modifications to improve her current health and to decrease her risk of future health problems.  AGREE: Multiple dietary modification options and treatment options were discussed and  Dillyn agreed to follow the recommendations documented in the above note.  ARRANGE: Tobin was educated on the importance of frequent visits to treat obesity as outlined per CMS and USPSTF guidelines and agreed to schedule her next follow up appointment today.  I, Trixie Dredge, am acting as transcriptionist for Dennard Nip, MD   I have reviewed the above documentation for accuracy and completeness, and I agree with the above. -Dennard Nip, MD

## 2018-05-13 ENCOUNTER — Encounter (INDEPENDENT_AMBULATORY_CARE_PROVIDER_SITE_OTHER): Payer: Self-pay | Admitting: Family Medicine

## 2018-05-19 ENCOUNTER — Ambulatory Visit (INDEPENDENT_AMBULATORY_CARE_PROVIDER_SITE_OTHER): Payer: 59 | Admitting: Family Medicine

## 2018-05-19 VITALS — BP 104/69 | HR 62 | Temp 97.7°F | Ht 66.0 in | Wt 213.0 lb

## 2018-05-19 DIAGNOSIS — E669 Obesity, unspecified: Secondary | ICD-10-CM | POA: Diagnosis not present

## 2018-05-19 DIAGNOSIS — Z9189 Other specified personal risk factors, not elsewhere classified: Secondary | ICD-10-CM | POA: Diagnosis not present

## 2018-05-19 DIAGNOSIS — E119 Type 2 diabetes mellitus without complications: Secondary | ICD-10-CM

## 2018-05-19 DIAGNOSIS — Z6834 Body mass index (BMI) 34.0-34.9, adult: Secondary | ICD-10-CM

## 2018-05-19 DIAGNOSIS — E559 Vitamin D deficiency, unspecified: Secondary | ICD-10-CM

## 2018-05-19 MED ORDER — VITAMIN D (ERGOCALCIFEROL) 1.25 MG (50000 UNIT) PO CAPS: 50000.0000 [IU] | ORAL_CAPSULE | ORAL | 0 refills | Status: DC

## 2018-05-24 NOTE — Progress Notes (Signed)
Office: [phone]  /  Fax: [phone]   HPI:   Chief Complaint: OBESITY Sherry Palmer is here to discuss her progress with her obesity treatment plan. She is on the Category 2 plan and is following her eating plan approximately 95 % of the time. She states she is doing cardio strength training 60 minutes 4 times per week. Sherry Palmer did very well with weight loss on the Category 2 plan. She decreased polyphagia when she ate all of her food.  Her weight is 213 lb (96.6 kg) today and has had a weight loss of 4 pounds over a period of 2 weeks since her last visit. She has lost 4 lbs since starting treatment with [place].  Vitamin D deficiency Sherry Palmer has a new diagnosis of vitamin D deficiency. She is not currently taking vit D and admits fatigue.   Diabetes II Sherry Palmer has a diagnosis of diabetes type II. Sherry Palmer states her fasting BGs range between 84 and 145 and have to decrease to mostly 110's with eating plan. She is on metformin and her last A1c was 6.7. Her polyphagia has improved with her diet plan.   At risk for cardiovascular disease Sherry Palmer is at a higher than average risk for cardiovascular disease due to diabetes and obesity.   ALLERGIES: Allergies  Allergen Reactions  . Amoxicillin Hives and Other (See Comments)    Has patient had a PCN reaction causing immediate rash, facial/tongue/throat swelling, SOB or lightheadedness with hypotension: No Has patient had a PCN reaction causing severe rash involving mucus membranes or skin necrosis: No Has patient had a PCN reaction that required hospitalization No Has patient had a PCN reaction occurring within the last 10 years: No If all of the above answers are "NO", then may proceed with Cephalosporin use.    MEDICATIONS: Current Outpatient Medications on File Prior to Visit  Medication Sig Dispense Refill  . ferrous sulfate 325 (65 FE) MG tablet Take 325 mg by mouth daily with breakfast.    . folic acid (FOLVITE) 1 MG tablet Take 1 mg by mouth  daily.    . hydrochlorothiazide (HYDRODIURIL) 25 MG tablet Take 25 mg by mouth daily.    Sherry Palmer Kitchen labetalol (NORMODYNE) 100 MG tablet Take 100 mg by mouth once.     Sherry Palmer Kitchen levothyroxine (LEVOTHROID) 137 MCG tablet Take 137 mcg by mouth daily before breakfast.    . losartan (COZAAR) 100 MG tablet Take 100 mg by mouth daily.    . metFORMIN (GLUCOPHAGE) 500 MG tablet Take 1,000 mg by mouth 2 (two) times daily with a meal.      No current facility-administered medications on file prior to visit.     PAST MEDICAL HISTORY: Past Medical History:  Diagnosis Date  . Anemia   . Back pain   . Diabetes mellitus without complication ([place])    type 2  . Dyspnea   . Gallbladder problem   . Hypertension   . Hypothyroidism   . Joint pain   . Obesity   . Postpartum care following vaginal delivery (12/31) 09/01/2016  . Sleep apnea   . Vaginal Pap smear, abnormal     PAST SURGICAL HISTORY: Past Surgical History:  Procedure Laterality Date  . BUNIONECTOMY  2009  . CHOLECYSTECTOMY    . COLPOSCOPY      SOCIAL HISTORY: Social History   Tobacco Use  . Smoking status: Never Smoker  . Smokeless tobacco: Never Used  Substance Use Topics  . Alcohol use: No  . Drug use: No  FAMILY HISTORY: Family History  Problem Relation Age of Onset  . Hypertension Mother   . Hypothyroidism Mother   . Diabetes Mother   . Hyperlipidemia Mother   . Gout Mother   . Sleep apnea Mother   . Cancer Father        colon  . Hypertension Father   . Cancer Maternal Aunt   . Diabetes Paternal Aunt   . Cancer Maternal Grandmother     ROS: Review of Systems  Constitutional: Positive for malaise/fatigue and weight loss.  Endo/Heme/Allergies:       Positive for polyphagia.    PHYSICAL EXAM: Blood pressure 104/69, pulse 62, temperature 97.7 F (36.5 C), temperature source Oral, height 5\' 6"  (1.676 m), weight 213 lb (96.6 kg), last menstrual period 04/22/2018, SpO2 100 %, unknown if currently breastfeeding. Body mass  index is 34.38 kg/m. Physical Exam  Constitutional: She is oriented to person, place, and time. She appears well-developed and well-nourished.  Cardiovascular: Normal rate.  Pulmonary/Chest: Effort normal.  Musculoskeletal: Normal range of motion.  Neurological: She is oriented to person, place, and time.  Skin: Skin is warm and dry.  Psychiatric: She has a normal mood and affect. Her behavior is normal.  Vitals reviewed.   RECENT LABS AND TESTS: BMET    Component Value Date/Time   NA 138 05/05/2018 0914   K 3.6 05/05/2018 0914   CL 98 05/05/2018 0914   CO2 22 05/05/2018 0914   GLUCOSE 107 (H) 05/05/2018 0914   GLUCOSE 118 (H) 09/01/2016 0646   BUN 11 05/05/2018 0914   CREATININE 0.79 05/05/2018 0914   CALCIUM 9.3 05/05/2018 0914   GFRNONAA 92 05/05/2018 0914   GFRAA 106 05/05/2018 0914   Lab Results  Component Value Date   HGBA1C 6.7 (H) 05/05/2018   Lab Results  Component Value Date   INSULIN 26.5 (H) 05/05/2018   CBC    Component Value Date/Time   WBC 8.0 05/05/2018 0914   WBC 14.3 (H) 09/01/2016 0646   RBC 5.28 05/05/2018 0914   RBC 3.96 09/01/2016 0646   HGB 11.7 05/05/2018 0914   HCT 36.4 05/05/2018 0914   PLT 231 09/01/2016 0646   MCV 69 (L) 05/05/2018 0914   MCH 22.2 (L) 05/05/2018 0914   MCH 24.0 (L) 09/01/2016 0646   MCHC 32.1 05/05/2018 0914   MCHC 34.3 09/01/2016 0646   RDW 15.9 (H) 05/05/2018 0914   LYMPHSABS 2.1 05/05/2018 0914   EOSABS 0.4 05/05/2018 0914   BASOSABS 0.1 05/05/2018 0914   Iron/TIBC/Ferritin/ %Sat No results found for: IRON, TIBC, FERRITIN, IRONPCTSAT Lipid Panel     Component Value Date/Time   CHOL 149 05/05/2018 0914   TRIG 107 05/05/2018 0914   HDL 52 05/05/2018 0914   LDLCALC 76 05/05/2018 0914   Hepatic Function Panel     Component Value Date/Time   PROT 7.1 05/05/2018 0914   ALBUMIN 4.4 05/05/2018 0914   AST 16 05/05/2018 0914   ALT 16 05/05/2018 0914   ALKPHOS 95 05/05/2018 0914   BILITOT 0.3 05/05/2018  0914      Component Value Date/Time   TSH 0.988 05/05/2018 0914   Results for Sherry, Palmer (MRN [id number]) as of 05/24/2018 10:28  Ref. Range 05/05/2018 09:14  Vitamin D, 25-Hydroxy Latest Ref Range: 30.0 - 100.0 ng/mL 20.1 (L)   ASSESSMENT AND PLAN: Vitamin D deficiency - Plan: Vitamin D, Ergocalciferol, (DRISDOL) 50000 units CAPS capsule  Type 2 diabetes mellitus without complication, without long-term current use of  insulin (HCC)  At risk for heart disease  Class 1 obesity with serious comorbidity and body mass index (BMI) of 34.0 to 34.9 in adult, unspecified obesity type  PLAN:  Vitamin D Deficiency Sherry Palmer was informed that low vitamin D levels contributes to fatigue and are associated with obesity, breast, and colon cancer. She agrees to start to take prescription Vit D @50 ,000 IU every week #4 with no refills and will follow up for routine testing of vitamin D, at least 2-3 times per year. She was informed of the risk of over-replacement of vitamin D and agrees to not increase her dose unless she discusses this with [place] first. Sherry Palmer agrees to follow up in 2 weeks.  Diabetes II Sherry Palmer has been given extensive diabetes education by myself today including ideal fasting and post-prandial blood glucose readings, individual ideal Hgb A1c goals  and hypoglycemia prevention. We discussed the importance of good blood sugar control to decrease the likelihood of diabetic complications such as nephropathy, neuropathy, limb loss, blindness, coronary artery disease, and death. We discussed the importance of intensive lifestyle modification including diet, exercise and weight loss as the first line treatment for diabetes. Sherry Palmer agrees to continue her current metformin and diet and will follow up at the agreed upon time in 2 weeks. We will recheck labs in 3 months.  Cardiovascular risk counseling Sherry Palmer was given extended (15 minutes) coronary artery disease prevention counseling today. She is 42  y.o. female and has risk factors for heart disease including diabetes and obesity. We discussed intensive lifestyle modifications today with an emphasis on specific weight loss instructions and strategies. She was also informed of the importance of increasing exercise and decreasing saturated fats to help prevent heart disease.  Obesity Sherry Palmer is currently in the action stage of change. As such, her goal is to continue with weight loss efforts. She has agreed to follow the Category 2 plan. Sherry Palmer has been instructed to work up to a goal of 150 minutes of combined cardio and strengthening exercise per week for weight loss and overall health benefits. We discussed the following Behavioral Modification Strategies today: increasing lean protein intake, decreasing simple carbohydrates, and work on meal planning and easy cooking plans.  Sherry Palmer has agreed to follow up with our clinic in 2 weeks. She was informed of the importance of frequent follow up visits to maximize her success with intensive lifestyle modifications for her multiple health conditions.   OBESITY BEHAVIORAL INTERVENTION VISIT  Today's visit was # 2  Starting weight: 217 lbs Starting date: 05/05/18 Today's weight : Weight: 213 lb (96.6 kg)  Today's date: 05/19/2018 Total lbs lost to date: 4  ASK: We discussed the diagnosis of obesity with Sherry Palmer today and Sherry Palmer agreed to give [place] permission to discuss obesity behavioral modification therapy today.  ASSESS: Sherry Palmer has the diagnosis of obesity and her BMI today is 34.4. Sherry Palmer is in the action stage of change.   ADVISE: Sherry Palmer was educated on the multiple health risks of obesity as well as the benefit of weight loss to improve her health. She was advised of the need for long term treatment and the importance of lifestyle modifications to improve her current health and to decrease her risk of future health problems.  AGREE: Multiple dietary modification options and treatment  options were discussed and  Leaha agreed to follow the recommendations documented in the above note.  ARRANGE: Evolette was educated on the importance of frequent visits to treat obesity as outlined per  CMS and USPSTF guidelines and agreed to schedule her next follow up appointment today.  I, Marcille Blanco, am acting as transcriptionist for [employer]  I have reviewed the above documentation for accuracy and completeness, and I agree with the above. -Dennard Nip, MD

## 2018-06-01 ENCOUNTER — Ambulatory Visit (INDEPENDENT_AMBULATORY_CARE_PROVIDER_SITE_OTHER): Payer: 59 | Admitting: Family Medicine

## 2018-06-01 VITALS — BP 98/66 | HR 66 | Temp 97.9°F | Ht 66.0 in | Wt 210.0 lb

## 2018-06-01 DIAGNOSIS — E119 Type 2 diabetes mellitus without complications: Secondary | ICD-10-CM

## 2018-06-01 DIAGNOSIS — E669 Obesity, unspecified: Secondary | ICD-10-CM | POA: Diagnosis not present

## 2018-06-01 DIAGNOSIS — I10 Essential (primary) hypertension: Secondary | ICD-10-CM | POA: Diagnosis not present

## 2018-06-01 DIAGNOSIS — Z6834 Body mass index (BMI) 34.0-34.9, adult: Secondary | ICD-10-CM

## 2018-06-01 DIAGNOSIS — Z9189 Other specified personal risk factors, not elsewhere classified: Secondary | ICD-10-CM | POA: Diagnosis not present

## 2018-06-02 NOTE — Progress Notes (Signed)
Office: [phone]  /  Fax: [phone]   HPI:   Chief Complaint: OBESITY Sherry Palmer is here to discuss her progress with her obesity treatment plan. She is on the Category 2 plan and is following her eating plan approximately 80 % of the time. She states she is doing cardio and strength training for 60 minutes 2 times per week. Sherry Palmer continues to do very well with weight loss on her Category 2 plan. She states her hunger is mostly controlled and she is doing well with exercise. She is getting bored with her snacks and would like more ideas.  Her weight is 210 lb (95.3 kg) today and has had a weight loss of 3 pounds over a period of 2 weeks since her last visit. She has lost 7 lbs since starting treatment with [place].  Hypertension Sherry Palmer is a 42 y.o. female with hypertension. Sherry Palmer is on 3 blood pressure medications and her blood pressure continues to lower with weight loss. She is starting to feel lightheaded when she stands up quickly. She is working weight loss to help control her blood pressure with the goal of decreasing her risk of heart attack and stroke. Sherry Palmer's blood pressure is not currently controlled.  Diabetes II Sherry Palmer has a diagnosis of diabetes type II. Sherry Palmer states fasting BGs range between 85 and 139, and 2 hour post prandials range between 90 and 125 on metformin. She notes decreased polyphagia and denies nausea, vomiting, or hypoglycemia. Last A1c was 6.7. She has been working on intensive lifestyle modifications including diet, exercise, and weight loss to help control her blood glucose levels.  At risk for cardiovascular disease Sherry Palmer is at a higher than average risk for cardiovascular disease due to obesity, hypertension, and diabetes II. She currently denies any chest pain.  ALLERGIES: Allergies  Allergen Reactions  . Amoxicillin Hives and Other (See Comments)    Has patient had a PCN reaction causing immediate rash, facial/tongue/throat swelling, SOB or  lightheadedness with hypotension: No Has patient had a PCN reaction causing severe rash involving mucus membranes or skin necrosis: No Has patient had a PCN reaction that required hospitalization No Has patient had a PCN reaction occurring within the last 10 years: No If all of the above answers are "NO", then may proceed with Cephalosporin use.    MEDICATIONS: Current Outpatient Medications on File Prior to Visit  Medication Sig Dispense Refill  . ferrous sulfate 325 (65 FE) MG tablet Take 325 mg by mouth daily with breakfast.    . folic acid (FOLVITE) 1 MG tablet Take 1 mg by mouth daily.    . hydrochlorothiazide (HYDRODIURIL) 25 MG tablet Take 25 mg by mouth daily. Take 1/2 tab daily    . labetalol (NORMODYNE) 100 MG tablet Take 100 mg by mouth once.     Marland Kitchen levothyroxine (LEVOTHROID) 137 MCG tablet Take 137 mcg by mouth daily before breakfast.    . losartan (COZAAR) 100 MG tablet Take 100 mg by mouth daily.    . metFORMIN (GLUCOPHAGE) 500 MG tablet Take 1,000 mg by mouth 2 (two) times daily with a meal.     . Vitamin D, Ergocalciferol, (DRISDOL) 50000 units CAPS capsule Take 1 capsule (50,000 Units total) by mouth every 7 (seven) days. 4 capsule 0   No current facility-administered medications on file prior to visit.     PAST MEDICAL HISTORY: Past Medical History:  Diagnosis Date  . Anemia   . Back pain   . Diabetes mellitus without  complication ([place])    type 2  . Dyspnea   . Gallbladder problem   . Hypertension   . Hypothyroidism   . Joint pain   . Obesity   . Postpartum care following vaginal delivery (12/31) 09/01/2016  . Sleep apnea   . Vaginal Pap smear, abnormal     PAST SURGICAL HISTORY: Past Surgical History:  Procedure Laterality Date  . BUNIONECTOMY  2009  . CHOLECYSTECTOMY    . COLPOSCOPY      SOCIAL HISTORY: Social History   Tobacco Use  . Smoking status: Never Smoker  . Smokeless tobacco: Never Used  Substance Use Topics  . Alcohol use: No  . Drug  use: No    FAMILY HISTORY: Family History  Problem Relation Age of Onset  . Hypertension Mother   . Hypothyroidism Mother   . Diabetes Mother   . Hyperlipidemia Mother   . Gout Mother   . Sleep apnea Mother   . Cancer Father        colon  . Hypertension Father   . Cancer Maternal Aunt   . Diabetes Paternal Aunt   . Cancer Maternal Grandmother     ROS: Review of Systems  Constitutional: Positive for weight loss.  Cardiovascular: Negative for chest pain.  Gastrointestinal: Negative for nausea and vomiting.  Neurological:       Positive lightheadedness  Endo/Heme/Allergies:       Positive polyphagia Negative hypoglycemia    PHYSICAL EXAM: Blood pressure 98/66, pulse 66, temperature 97.9 F (36.6 C), temperature source Oral, height 5\' 6"  (1.676 m), weight 210 lb (95.3 kg), SpO2 100 %, unknown if currently breastfeeding. Body mass index is 33.89 kg/m. Physical Exam  Constitutional: She is oriented to person, place, and time. She appears well-developed and well-nourished.  Cardiovascular: Normal rate.  Pulmonary/Chest: Effort normal.  Musculoskeletal: Normal range of motion.  Neurological: She is oriented to person, place, and time.  Skin: Skin is warm and dry.  Psychiatric: She has a normal mood and affect. Her behavior is normal.  Vitals reviewed.   RECENT LABS AND TESTS: BMET    Component Value Date/Time   NA 138 05/05/2018 0914   K 3.6 05/05/2018 0914   CL 98 05/05/2018 0914   CO2 22 05/05/2018 0914   GLUCOSE 107 (H) 05/05/2018 0914   GLUCOSE 118 (H) 09/01/2016 0646   BUN 11 05/05/2018 0914   CREATININE 0.79 05/05/2018 0914   CALCIUM 9.3 05/05/2018 0914   GFRNONAA 92 05/05/2018 0914   GFRAA 106 05/05/2018 0914   Lab Results  Component Value Date   HGBA1C 6.7 (H) 05/05/2018   Lab Results  Component Value Date   INSULIN 26.5 (H) 05/05/2018   CBC    Component Value Date/Time   WBC 8.0 05/05/2018 0914   WBC 14.3 (H) 09/01/2016 0646   RBC 5.28  05/05/2018 0914   RBC 3.96 09/01/2016 0646   HGB 11.7 05/05/2018 0914   HCT 36.4 05/05/2018 0914   PLT 231 09/01/2016 0646   MCV 69 (L) 05/05/2018 0914   MCH 22.2 (L) 05/05/2018 0914   MCH 24.0 (L) 09/01/2016 0646   MCHC 32.1 05/05/2018 0914   MCHC 34.3 09/01/2016 0646   RDW 15.9 (H) 05/05/2018 0914   LYMPHSABS 2.1 05/05/2018 0914   EOSABS 0.4 05/05/2018 0914   BASOSABS 0.1 05/05/2018 0914   Iron/TIBC/Ferritin/ %Sat No results found for: IRON, TIBC, FERRITIN, IRONPCTSAT Lipid Panel     Component Value Date/Time   CHOL 149 05/05/2018 0914  TRIG 107 05/05/2018 0914   HDL 52 05/05/2018 0914   LDLCALC 76 05/05/2018 0914   Hepatic Function Panel     Component Value Date/Time   PROT 7.1 05/05/2018 0914   ALBUMIN 4.4 05/05/2018 0914   AST 16 05/05/2018 0914   ALT 16 05/05/2018 0914   ALKPHOS 95 05/05/2018 0914   BILITOT 0.3 05/05/2018 0914      Component Value Date/Time   TSH 0.988 05/05/2018 0914    ASSESSMENT AND PLAN: Essential hypertension  Type 2 diabetes mellitus without complication, without long-term current use of insulin (HCC)  At risk for heart disease  Class 1 obesity with serious comorbidity and body mass index (BMI) of 34.0 to 34.9 in adult, unspecified obesity type  PLAN:  Hypertension We discussed sodium restriction, working on healthy weight loss, and a regular exercise program as the means to achieve improved blood pressure control. Sherry Palmer agreed with this plan and agreed to follow up as directed. We will continue to monitor her blood pressure as well as her progress with the above lifestyle modifications. Sherry Palmer agrees to decrease hydrochlorothiazide to 1/2 tablet PO qd (12.5 mg) and keep her other medications the same. She will watch for signs of hypotension as she continues her lifestyle modifications. Sherry Palmer agrees to follow up with our clinic in 2 to 3 weeks and we will recheck blood pressure at that time.  Diabetes II Sherry Palmer has been given  extensive diabetes education by myself today including ideal fasting and post-prandial blood glucose readings, individual ideal Hgb A1c goals and hypoglycemia prevention. We discussed the importance of good blood sugar control to decrease the likelihood of diabetic complications such as nephropathy, neuropathy, limb loss, blindness, coronary artery disease, and death. We discussed the importance of intensive lifestyle modification including diet, exercise and weight loss as the first line treatment for diabetes. Sherry Palmer agrees to continue diet, exercise, and continue taking metformin. Sherry Palmer agrees to follow up with our clinic in 2 to 3 weeks.  Cardiovascular risk counselling Sherry Palmer was given extended (15 minutes) coronary artery disease prevention counseling today. She is 42 y.o. female and has risk factors for heart disease including obesity, hypertension, and diabetes II. We discussed intensive lifestyle modifications today with an emphasis on specific weight loss instructions and strategies. Pt was also informed of the importance of increasing exercise and decreasing saturated fats to help prevent heart disease.  Obesity Sherry Palmer is currently in the action stage of change. As such, her goal is to continue with weight loss efforts She has agreed to follow the Category 2 plan Sherry Palmer has been instructed to work up to a goal of 150 minutes of combined cardio and strengthening exercise per week for weight loss and overall health benefits. We discussed the following Behavioral Modification Strategies today: increasing lean protein intake and better snacking choices   Sherry Palmer has agreed to follow up with our clinic in 2 to 3 weeks. She was informed of the importance of frequent follow up visits to maximize her success with intensive lifestyle modifications for her multiple health conditions.   OBESITY BEHAVIORAL INTERVENTION VISIT  Today's visit was # 3   Starting weight: 217 lbs Starting date:  05/05/18 Today's weight : 210 lbs Today's date: 06/01/2018 Total lbs lost to date: 7    ASK: We discussed the diagnosis of obesity with Sherry Palmer today and Sherry Palmer agreed to give [place] permission to discuss obesity behavioral modification therapy today.  ASSESS: Sherry Palmer has the diagnosis of obesity and her BMI  today is 33.91 Sherry Palmer is in the action stage of change   ADVISE: Sherry Palmer was educated on the multiple health risks of obesity as well as the benefit of weight loss to improve her health. She was advised of the need for long term treatment and the importance of lifestyle modifications to improve her current health and to decrease her risk of future health problems.  AGREE: Multiple dietary modification options and treatment options were discussed and  Joyice agreed to follow the recommendations documented in the above note.  ARRANGE: Stamatia was educated on the importance of frequent visits to treat obesity as outlined per CMS and USPSTF guidelines and agreed to schedule her next follow up appointment today.  I, Trixie Dredge, am acting as transcriptionist for Dennard Nip, MD  I have reviewed the above documentation for accuracy and completeness, and I agree with the above. -Dennard Nip, MD

## 2018-06-03 DIAGNOSIS — Z23 Encounter for immunization: Secondary | ICD-10-CM | POA: Diagnosis not present

## 2018-06-10 ENCOUNTER — Other Ambulatory Visit (INDEPENDENT_AMBULATORY_CARE_PROVIDER_SITE_OTHER): Payer: Self-pay | Admitting: Family Medicine

## 2018-06-10 DIAGNOSIS — E559 Vitamin D deficiency, unspecified: Secondary | ICD-10-CM

## 2018-06-14 DIAGNOSIS — D649 Anemia, unspecified: Secondary | ICD-10-CM | POA: Diagnosis not present

## 2018-06-22 ENCOUNTER — Ambulatory Visit (INDEPENDENT_AMBULATORY_CARE_PROVIDER_SITE_OTHER): Payer: 59 | Admitting: Family Medicine

## 2018-06-22 VITALS — BP 123/79 | HR 70 | Temp 97.8°F | Ht 66.0 in | Wt 213.0 lb

## 2018-06-22 DIAGNOSIS — E559 Vitamin D deficiency, unspecified: Secondary | ICD-10-CM | POA: Diagnosis not present

## 2018-06-22 DIAGNOSIS — Z6834 Body mass index (BMI) 34.0-34.9, adult: Secondary | ICD-10-CM | POA: Diagnosis not present

## 2018-06-22 DIAGNOSIS — E669 Obesity, unspecified: Secondary | ICD-10-CM

## 2018-06-23 NOTE — Progress Notes (Signed)
Office: [phone]  /  Fax: [phone]   HPI:   Chief Complaint: OBESITY Sherry Palmer is here to discuss her progress with her obesity treatment plan. She is on the Category 2 Plan and is following her eating plan approximately 25 % of the time. She states she is exercising 0 minutes 0 times per week. Sherry Palmer is currently struggling with eating out during the time her mother was hospitalized. She is retaining fluid today but has no increase in fat.   Her weight is 213 lb (96.6 kg) today and has not lost weight since her last visit. Today she gained 3 lbs from her last visit. She has lost 7 lbs since starting treatment with [place].  Vitamin D deficiency Sherry Palmer has a diagnosis of vitamin D deficiency. She is stable on prescription Vit D, but level is not yet at goal. She denies nausea, vomiting or muscle weakness.  ALLERGIES: Allergies  Allergen Reactions  . Amoxicillin Hives and Other (See Comments)    Has patient had a PCN reaction causing immediate rash, facial/tongue/throat swelling, SOB or lightheadedness with hypotension: No Has patient had a PCN reaction causing severe rash involving mucus membranes or skin necrosis: No Has patient had a PCN reaction that required hospitalization No Has patient had a PCN reaction occurring within the last 10 years: No If all of the above answers are "NO", then may proceed with Cephalosporin use.    MEDICATIONS: Current Outpatient Medications on File Prior to Visit  Medication Sig Dispense Refill  . ferrous sulfate 325 (65 FE) MG tablet Take 325 mg by mouth daily with breakfast.    . folic acid (FOLVITE) 1 MG tablet Take 1 mg by mouth daily.    . hydrochlorothiazide (HYDRODIURIL) 25 MG tablet Take 25 mg by mouth daily. Take 1/2 tab daily    . labetalol (NORMODYNE) 100 MG tablet Take 100 mg by mouth once.     Marland Kitchen levothyroxine (LEVOTHROID) 137 MCG tablet Take 137 mcg by mouth daily before breakfast.    . losartan (COZAAR) 100 MG tablet Take 100 mg by mouth  daily.    . metFORMIN (GLUCOPHAGE) 500 MG tablet Take 1,000 mg by mouth 2 (two) times daily with a meal.     . Vitamin D, Ergocalciferol, (DRISDOL) 50000 units CAPS capsule TAKE 1 CAPSULE (50,000 UNITS TOTAL) BY MOUTH EVERY 7 (SEVEN) DAYS. 4 capsule 0   No current facility-administered medications on file prior to visit.     PAST MEDICAL HISTORY: Past Medical History:  Diagnosis Date  . Anemia   . Back pain   . Diabetes mellitus without complication ([place])    type 2  . Dyspnea   . Gallbladder problem   . Hypertension   . Hypothyroidism   . Joint pain   . Obesity   . Postpartum care following vaginal delivery (12/31) 09/01/2016  . Sleep apnea   . Vaginal Pap smear, abnormal     PAST SURGICAL HISTORY: Past Surgical History:  Procedure Laterality Date  . BUNIONECTOMY  2009  . CHOLECYSTECTOMY    . COLPOSCOPY      SOCIAL HISTORY: Social History   Tobacco Use  . Smoking status: Never Smoker  . Smokeless tobacco: Never Used  Substance Use Topics  . Alcohol use: No  . Drug use: No    FAMILY HISTORY: Family History  Problem Relation Age of Onset  . Hypertension Mother   . Hypothyroidism Mother   . Diabetes Mother   . Hyperlipidemia Mother   .  Gout Mother   . Sleep apnea Mother   . Cancer Father        colon  . Hypertension Father   . Cancer Maternal Aunt   . Diabetes Paternal Aunt   . Cancer Maternal Grandmother     ROS: Review of Systems  Constitutional: Negative for weight loss.  Gastrointestinal: Negative for nausea and vomiting.  Musculoskeletal:       Negative muscle weakness    PHYSICAL EXAM: Blood pressure 123/79, pulse 70, temperature 97.8 F (36.6 C), temperature source Oral, height 5\' 6"  (1.676 m), weight 213 lb (96.6 kg), SpO2 98 %, unknown if currently breastfeeding. Body mass index is 34.38 kg/m. Physical Exam  Constitutional: She is oriented to person, place, and time. She appears well-developed and well-nourished.  Cardiovascular: Normal  rate.  Pulmonary/Chest: Effort normal.  Musculoskeletal: Normal range of motion.  Neurological: She is oriented to person, place, and time.  Skin: Skin is warm and dry.  Psychiatric: She has a normal mood and affect. Her behavior is normal.  Vitals reviewed.   RECENT LABS AND TESTS: BMET    Component Value Date/Time   NA 138 05/05/2018 0914   K 3.6 05/05/2018 0914   CL 98 05/05/2018 0914   CO2 22 05/05/2018 0914   GLUCOSE 107 (H) 05/05/2018 0914   GLUCOSE 118 (H) 09/01/2016 0646   BUN 11 05/05/2018 0914   CREATININE 0.79 05/05/2018 0914   CALCIUM 9.3 05/05/2018 0914   GFRNONAA 92 05/05/2018 0914   GFRAA 106 05/05/2018 0914   Lab Results  Component Value Date   HGBA1C 6.7 (H) 05/05/2018   Lab Results  Component Value Date   INSULIN 26.5 (H) 05/05/2018   CBC    Component Value Date/Time   WBC 8.0 05/05/2018 0914   WBC 14.3 (H) 09/01/2016 0646   RBC 5.28 05/05/2018 0914   RBC 3.96 09/01/2016 0646   HGB 11.7 05/05/2018 0914   HCT 36.4 05/05/2018 0914   PLT 231 09/01/2016 0646   MCV 69 (L) 05/05/2018 0914   MCH 22.2 (L) 05/05/2018 0914   MCH 24.0 (L) 09/01/2016 0646   MCHC 32.1 05/05/2018 0914   MCHC 34.3 09/01/2016 0646   RDW 15.9 (H) 05/05/2018 0914   LYMPHSABS 2.1 05/05/2018 0914   EOSABS 0.4 05/05/2018 0914   BASOSABS 0.1 05/05/2018 0914   Iron/TIBC/Ferritin/ %Sat No results found for: IRON, TIBC, FERRITIN, IRONPCTSAT Lipid Panel     Component Value Date/Time   CHOL 149 05/05/2018 0914   TRIG 107 05/05/2018 0914   HDL 52 05/05/2018 0914   LDLCALC 76 05/05/2018 0914   Hepatic Function Panel     Component Value Date/Time   PROT 7.1 05/05/2018 0914   ALBUMIN 4.4 05/05/2018 0914   AST 16 05/05/2018 0914   ALT 16 05/05/2018 0914   ALKPHOS 95 05/05/2018 0914   BILITOT 0.3 05/05/2018 0914      Component Value Date/Time   TSH 0.988 05/05/2018 0914  Results for CHIANA, WAMSER (MRN [id number]) as of 06/23/2018 15:58  Ref. Range 05/05/2018 09:14    Vitamin D, 25-Hydroxy Latest Ref Range: 30.0 - 100.0 ng/mL 20.1 (L)    ASSESSMENT AND PLAN: Vitamin D deficiency  Class 1 obesity with serious comorbidity and body mass index (BMI) of 34.0 to 34.9 in adult, unspecified obesity type.  PLAN:  Vitamin D Deficiency Sherry Palmer was informed that low vitamin D levels contributes to fatigue and are associated with obesity, breast, and colon cancer. Sherry Palmer agrees to continue taking  prescription Vit D @50 ,000 IU every week and will follow up for routine testing of vitamin D, at least 2-3 times per year. She was informed of the risk of over-replacement of vitamin D and agrees to not increase her dose unless she discusses this with [place] first. We will recheck labs in 6 weeks. Sherry Palmer agrees to follow up with our clinic in 2 weeks.  I spent > than 50% of the 15 minute visit on counseling as documented in the note.  Obesity Sherry Palmer is not currently in the action stage of change. As such, her goal is to continue with weight loss efforts. She has agreed to follow the Category 2 plan. Sherry Palmer has been instructed to work up to a goal of 150 minutes of combined cardio and strengthening exercise per week for weight loss and overall health benefits. We discussed the following Behavioral Modification Strategies today: increasing lean protein intake, decreasing simple carbohydrates , work on meal planning and easy cooking plans and emotional eating strategies and planning for success  We discussed different ways to keep Cat 2 food plan more interesting.   Sherry Palmer has agreed to follow up with our clinic in 2 weeks. She was informed of the importance of frequent follow up visits to maximize her success with intensive lifestyle modifications for her multiple health conditions.    OBESITY BEHAVIORAL INTERVENTION VISIT  Today's visit was # 4   Starting weight: 217 lbs  Starting date: 05/05/2018 Today's weight: 213 lbs  Today's date: 06/22/2018 Total lbs lost to date: 4  lbs At least 15 minutes were spent on discussing the following behavioral intervention visit.   ASK: We discussed the diagnosis of obesity with Sherry Palmer today and Sherry Palmer agreed to give [place] permission to discuss obesity behavioral modification therapy today.  ASSESS: Sherry Palmer has the diagnosis of obesity and her BMI today is 34.4. Sherry Palmer is not in the action stage of change at this time  ADVISE: Sherry Palmer was educated on the multiple health risks of obesity as well as the benefit of weight loss to improve her health. She was advised of the need for long term treatment and the importance of lifestyle modifications to improve her current health and to decrease her risk of future health problems.  AGREE: Multiple dietary modification options and treatment options were discussed and  Sherry Palmer agreed to follow the recommendations documented in the above note.  ARRANGE: Xana was educated on the importance of frequent visits to treat obesity as outlined per CMS and USPSTF guidelines and agreed to schedule her next follow up appointment today.  I, Sherry Palmer, CMA, am acting as transcriptionist for Starlyn Skeans, MD.   I have reviewed the above documentation for accuracy and completeness, and I agree with the above. -Dennard Nip, MD

## 2018-06-29 ENCOUNTER — Encounter (INDEPENDENT_AMBULATORY_CARE_PROVIDER_SITE_OTHER): Payer: Self-pay | Admitting: Family Medicine

## 2018-07-03 ENCOUNTER — Other Ambulatory Visit (INDEPENDENT_AMBULATORY_CARE_PROVIDER_SITE_OTHER): Payer: Self-pay | Admitting: Family Medicine

## 2018-07-03 DIAGNOSIS — E559 Vitamin D deficiency, unspecified: Secondary | ICD-10-CM

## 2018-07-06 ENCOUNTER — Encounter (INDEPENDENT_AMBULATORY_CARE_PROVIDER_SITE_OTHER): Payer: Self-pay | Admitting: Family Medicine

## 2018-07-06 ENCOUNTER — Ambulatory Visit (INDEPENDENT_AMBULATORY_CARE_PROVIDER_SITE_OTHER): Payer: 59 | Admitting: Family Medicine

## 2018-07-06 VITALS — BP 104/72 | HR 82 | Temp 97.8°F | Ht 66.0 in | Wt 209.0 lb

## 2018-07-06 DIAGNOSIS — Z9189 Other specified personal risk factors, not elsewhere classified: Secondary | ICD-10-CM

## 2018-07-06 DIAGNOSIS — E559 Vitamin D deficiency, unspecified: Secondary | ICD-10-CM

## 2018-07-06 DIAGNOSIS — E669 Obesity, unspecified: Secondary | ICD-10-CM

## 2018-07-06 DIAGNOSIS — I10 Essential (primary) hypertension: Secondary | ICD-10-CM

## 2018-07-06 DIAGNOSIS — Z6833 Body mass index (BMI) 33.0-33.9, adult: Secondary | ICD-10-CM

## 2018-07-06 DIAGNOSIS — E119 Type 2 diabetes mellitus without complications: Secondary | ICD-10-CM

## 2018-07-06 MED ORDER — VITAMIN D (ERGOCALCIFEROL) 1.25 MG (50000 UNIT) PO CAPS: 50000.0000 [IU] | ORAL_CAPSULE | ORAL | 0 refills | Status: DC

## 2018-07-06 NOTE — Progress Notes (Signed)
Office: [phone]  /  Fax: [phone]   HPI:   Chief Complaint: OBESITY Sherry Palmer is here to discuss her progress with her obesity treatment plan. She is on the Category 2 plan and is following her eating plan approximately 90% of the time. She states she is doing cardio and weights 60 minutes 5 times in 2 weeks. Sherry Palmer is bored with her dinner options. She is doing well with meal planning and prepping. Her weight is 209 lb (94.8 kg) today and has had a weight loss of 4 pounds over a period of 2 weeks since her last visit. She has lost 8 lbs since starting treatment with [place].  Vitamin D deficiency Sherry Palmer has a diagnosis of vitamin D deficiency. Sherry Palmer is stable on vit D but she is not yet at goal. Sherry Palmer denies nausea, vomiting or muscle weakness.  At risk for osteopenia and osteoporosis Sherry Palmer is at higher risk of osteopenia and osteoporosis due to vitamin D deficiency.   Diabetes II [name] has a diagnosis of diabetes type II. Sherry Palmer states fasting BGs range between 100 and 130 and 2 hour post prandial BGs range between 110 and 125. Sherry Palmer denies any hypoglycemic episodes, nausea, vomiting or diarrhea. Last A1c was at 6.7 on 05/05/18 and her diabetes is well-controlled. . She has been working on intensive lifestyle modifications including diet, exercise, and weight loss to help control her blood glucose levels. She is taking metformin 1000 mg twice daily.  ALLERGIES: Allergies  Allergen Reactions  . Amoxicillin Hives and Other (See Comments)    Has patient had a PCN reaction causing immediate rash, facial/tongue/throat swelling, SOB or lightheadedness with hypotension: No Has patient had a PCN reaction causing severe rash involving mucus membranes or skin necrosis: No Has patient had a PCN reaction that required hospitalization No Has patient had a PCN reaction occurring within the last 10 years: No If all of the above answers are "NO", then may proceed with Cephalosporin use.     MEDICATIONS: Current Outpatient Medications on File Prior to Visit  Medication Sig Dispense Refill  . ferrous sulfate 325 (65 FE) MG tablet Take 325 mg by mouth daily with breakfast.    . folic acid (FOLVITE) 1 MG tablet Take 1 mg by mouth daily.    . hydrochlorothiazide (HYDRODIURIL) 25 MG tablet Take 25 mg by mouth daily. Take 1/2 tab daily    . labetalol (NORMODYNE) 100 MG tablet Take 100 mg by mouth once.     Marland Kitchen levothyroxine (LEVOTHROID) 137 MCG tablet Take 137 mcg by mouth daily before breakfast.    . losartan (COZAAR) 100 MG tablet Take 100 mg by mouth daily.    . metFORMIN (GLUCOPHAGE) 500 MG tablet Take 1,000 mg by mouth 2 (two) times daily with a meal.      No current facility-administered medications on file prior to visit.     PAST MEDICAL HISTORY: Past Medical History:  Diagnosis Date  . Anemia   . Back pain   . Diabetes mellitus without complication ([place])    type 2  . Dyspnea   . Gallbladder problem   . Hypertension   . Hypothyroidism   . Joint pain   . Obesity   . Postpartum care following vaginal delivery (12/31) 09/01/2016  . Sleep apnea   . Vaginal Pap smear, abnormal     PAST SURGICAL HISTORY: Past Surgical History:  Procedure Laterality Date  . BUNIONECTOMY  2009  . CHOLECYSTECTOMY    . COLPOSCOPY  SOCIAL HISTORY: Social History   Tobacco Use  . Smoking status: Never Smoker  . Smokeless tobacco: Never Used  Substance Use Topics  . Alcohol use: No  . Drug use: No    FAMILY HISTORY: Family History  Problem Relation Age of Onset  . Hypertension Mother   . Hypothyroidism Mother   . Diabetes Mother   . Hyperlipidemia Mother   . Gout Mother   . Sleep apnea Mother   . Cancer Father        colon  . Hypertension Father   . Cancer Maternal Aunt   . Diabetes Paternal Aunt   . Cancer Maternal Grandmother     ROS: Review of Systems  Constitutional: Positive for weight loss.  Gastrointestinal: Negative for diarrhea, nausea and  vomiting.  Musculoskeletal:       Negative for muscle weakness  Endo/Heme/Allergies:       Negative for hypoglycemia    PHYSICAL EXAM: Blood pressure 104/72, pulse 82, temperature 97.8 F (36.6 C), temperature source Oral, height 5\' 6"  (1.676 m), weight 209 lb (94.8 kg), SpO2 97 %, unknown if currently breastfeeding. Body mass index is 33.73 kg/m. Physical Exam  Constitutional: She is oriented to person, place, and time. She appears well-developed and well-nourished.  Cardiovascular: Normal rate.  Pulmonary/Chest: Effort normal.  Musculoskeletal: Normal range of motion.  Neurological: She is oriented to person, place, and time.  Skin: Skin is warm and dry.  Psychiatric: She has a normal mood and affect. Her behavior is normal.  Vitals reviewed.   RECENT LABS AND TESTS: BMET    Component Value Date/Time   NA 138 05/05/2018 0914   K 3.6 05/05/2018 0914   CL 98 05/05/2018 0914   CO2 22 05/05/2018 0914   GLUCOSE 107 (H) 05/05/2018 0914   GLUCOSE 118 (H) 09/01/2016 0646   BUN 11 05/05/2018 0914   CREATININE 0.79 05/05/2018 0914   CALCIUM 9.3 05/05/2018 0914   GFRNONAA 92 05/05/2018 0914   GFRAA 106 05/05/2018 0914   Lab Results  Component Value Date   HGBA1C 6.7 (H) 05/05/2018   Lab Results  Component Value Date   INSULIN 26.5 (H) 05/05/2018   CBC    Component Value Date/Time   WBC 8.0 05/05/2018 0914   WBC 14.3 (H) 09/01/2016 0646   RBC 5.28 05/05/2018 0914   RBC 3.96 09/01/2016 0646   HGB 11.7 05/05/2018 0914   HCT 36.4 05/05/2018 0914   PLT 231 09/01/2016 0646   MCV 69 (L) 05/05/2018 0914   MCH 22.2 (L) 05/05/2018 0914   MCH 24.0 (L) 09/01/2016 0646   MCHC 32.1 05/05/2018 0914   MCHC 34.3 09/01/2016 0646   RDW 15.9 (H) 05/05/2018 0914   LYMPHSABS 2.1 05/05/2018 0914   EOSABS 0.4 05/05/2018 0914   BASOSABS 0.1 05/05/2018 0914   Iron/TIBC/Ferritin/ %Sat No results found for: IRON, TIBC, FERRITIN, IRONPCTSAT Lipid Panel     Component Value Date/Time    CHOL 149 05/05/2018 0914   TRIG 107 05/05/2018 0914   HDL 52 05/05/2018 0914   LDLCALC 76 05/05/2018 0914   Hepatic Function Panel     Component Value Date/Time   PROT 7.1 05/05/2018 0914   ALBUMIN 4.4 05/05/2018 0914   AST 16 05/05/2018 0914   ALT 16 05/05/2018 0914   ALKPHOS 95 05/05/2018 0914   BILITOT 0.3 05/05/2018 0914      Component Value Date/Time   TSH 0.988 05/05/2018 0914   Results for OBIE, SILOS (MRN [id number]) as  of 07/06/2018 14:14  Ref. Range 05/05/2018 09:14  Vitamin D, 25-Hydroxy Latest Ref Range: 30.0 - 100.0 ng/mL 20.1 (L)   ASSESSMENT AND PLAN: Vitamin D deficiency - Plan: Vitamin D, Ergocalciferol, (DRISDOL) 50000 units CAPS capsule  Type 2 diabetes mellitus without complication, without long-term current use of insulin ([facility])  Essential hypertension  At risk for osteoporosis  Class 1 obesity with serious comorbidity and body mass index (BMI) of 33.0 to 33.9 in adult, unspecified obesity type  PLAN:  Vitamin D Deficiency Mallary was informed that low vitamin D levels contributes to fatigue and are associated with obesity, breast, and colon cancer. She agrees to continue to take prescription Vit D @50 ,000 IU every week #4 with no refills and will follow up for routine testing of vitamin D, at least 2-3 times per year. She was informed of the risk of over-replacement of vitamin D and agrees to not increase her dose unless she discusses this with [place] first. We will recheck levels in 2 months.  At risk for osteopenia and osteoporosis Nadina was given extended  (15 minutes) osteoporosis prevention counseling today. Dale is at risk for osteopenia and osteoporosis due to her vitamin D deficiency. She was encouraged to take her vitamin D and follow her higher calcium diet and increase strengthening exercise to help strengthen her bones and decrease her risk of osteopenia and osteoporosis.  Diabetes II Syriah has been given extensive diabetes education by  myself today including ideal fasting and post-prandial blood glucose readings, individual ideal Hgb A1c goals and hypoglycemia prevention. We discussed the importance of good blood sugar control to decrease the likelihood of diabetic complications such as nephropathy, neuropathy, limb loss, blindness, coronary artery disease, and death. We discussed the importance of intensive lifestyle modification including diet, exercise and weight loss as the first line treatment for diabetes. Timmy agrees to continue metformin and diet. Avilyn will follow up at the agreed upon time.  Obesity Denea is currently in the action stage of change. As such, her goal is to continue with weight loss efforts She has agreed to follow the Category 2 plan Malaijah will continue doing cardio and weights for 60 minutes 5 times in 2 weeks for weight loss and overall health benefits. We discussed the following Behavioral Modification Strategies today: planning for success and work on meal planning and easy cooking plans  Handout was provided to patient today for dinner ideas  Miyuki has agreed to follow up with our clinic in 2 weeks. She was informed of the importance of frequent follow up visits to maximize her success with intensive lifestyle modifications for her multiple health conditions.   OBESITY BEHAVIORAL INTERVENTION VISIT  Today's visit was # 5  Starting weight: 217 lbs Starting date: 05/05/18 Today's weight : 209 lbs  Today's date: 07/06/2018 Total lbs lost to date: 8   ASK: We discussed the diagnosis of obesity with Yolande Jolly today and Ronasia agreed to give [place] permission to discuss obesity behavioral modification therapy today.  ASSESS: Amorette has the diagnosis of obesity and her BMI today is 33.75 Pegi is in the action stage of change   ADVISE: Maiya was educated on the multiple health risks of obesity as well as the benefit of weight loss to improve her health. She was advised of the need for long term  treatment and the importance of lifestyle modifications to improve her current health and to decrease her risk of future health problems.  AGREE: Multiple dietary modification options and treatment  options were discussed and  Arnette agreed to follow the recommendations documented in the above note.  ARRANGE: Aleja was educated on the importance of frequent visits to treat obesity as outlined per CMS and USPSTF guidelines and agreed to schedule her next follow up appointment today.  Corey Skains, am acting as Location manager for [employer], FNP-C.  I have reviewed the above documentation for accuracy and completeness, and I agree with the above.  - Shaneya Taketa, FNP-C.

## 2018-07-20 ENCOUNTER — Ambulatory Visit (INDEPENDENT_AMBULATORY_CARE_PROVIDER_SITE_OTHER): Payer: 59 | Admitting: Family Medicine

## 2018-07-20 VITALS — BP 132/86 | HR 72 | Temp 97.8°F | Ht 66.0 in | Wt 207.0 lb

## 2018-07-20 DIAGNOSIS — E669 Obesity, unspecified: Secondary | ICD-10-CM | POA: Diagnosis not present

## 2018-07-20 DIAGNOSIS — Z6833 Body mass index (BMI) 33.0-33.9, adult: Secondary | ICD-10-CM | POA: Diagnosis not present

## 2018-07-20 DIAGNOSIS — E119 Type 2 diabetes mellitus without complications: Secondary | ICD-10-CM

## 2018-07-20 DIAGNOSIS — E559 Vitamin D deficiency, unspecified: Secondary | ICD-10-CM

## 2018-07-22 NOTE — Progress Notes (Signed)
Office: [phone]  /  Fax: [phone]   HPI:   Chief Complaint: OBESITY Sherry Palmer is here to discuss her progress with her obesity treatment plan. She is on the Category 2 plan and is following her eating plan approximately 90% of the time. She states she is exercising 0 minutes 0 times per week. Sherry Palmer states she is eating all of the food on the plan.  Her weight is 207 lb (93.9 kg) today and has had a weight loss of 2 pounds over a period of 2 weeks since her last visit. She has lost 10 lbs since starting treatment with [place].  Diabetes II Sherry Palmer has a diagnosis of diabetes type II. Sherry Palmer states fasting BGs range between 112 and 115, and 2 hour post prandials range in 110's, well controlled. She is on metformin and denies nausea, vomiting, or diarrhea. Last A1c was 6.7. She denies hypoglycemia or polyphagia. She has been working on intensive lifestyle modifications including diet, exercise, and weight loss to help control her blood glucose levels.  Vitamin D Deficiency Sherry Palmer has a diagnosis of vitamin D deficiency. She is stable on prescription Vit D, but level is not at goal. Last Vit D level was 20.1 on 05/05/18. She denies nausea, vomiting or muscle weakness.  ALLERGIES: Allergies  Allergen Reactions  . Amoxicillin Hives and Other (See Comments)    Has patient had a PCN reaction causing immediate rash, facial/tongue/throat swelling, SOB or lightheadedness with hypotension: No Has patient had a PCN reaction causing severe rash involving mucus membranes or skin necrosis: No Has patient had a PCN reaction that required hospitalization No Has patient had a PCN reaction occurring within the last 10 years: No If all of the above answers are "NO", then may proceed with Cephalosporin use.    MEDICATIONS: Current Outpatient Medications on File Prior to Visit  Medication Sig Dispense Refill  . ferrous sulfate 325 (65 FE) MG tablet Take 325 mg by mouth daily with breakfast.    . folic acid  (FOLVITE) 1 MG tablet Take 1 mg by mouth daily.    . hydrochlorothiazide (HYDRODIURIL) 25 MG tablet Take 25 mg by mouth daily. Take 1/2 tab daily    . labetalol (NORMODYNE) 100 MG tablet Take 100 mg by mouth once.     Marland Kitchen levothyroxine (LEVOTHROID) 137 MCG tablet Take 137 mcg by mouth daily before breakfast.    . losartan (COZAAR) 100 MG tablet Take 100 mg by mouth daily.    . metFORMIN (GLUCOPHAGE) 500 MG tablet Take 1,000 mg by mouth 2 (two) times daily with a meal.     . Vitamin D, Ergocalciferol, (DRISDOL) 50000 units CAPS capsule Take 1 capsule (50,000 Units total) by mouth every 7 (seven) days. 4 capsule 0   No current facility-administered medications on file prior to visit.     PAST MEDICAL HISTORY: Past Medical History:  Diagnosis Date  . Anemia   . Back pain   . Diabetes mellitus without complication ([place])    type 2  . Dyspnea   . Gallbladder problem   . Hypertension   . Hypothyroidism   . Joint pain   . Obesity   . Postpartum care following vaginal delivery (12/31) 09/01/2016  . Sleep apnea   . Vaginal Pap smear, abnormal     PAST SURGICAL HISTORY: Past Surgical History:  Procedure Laterality Date  . BUNIONECTOMY  2009  . CHOLECYSTECTOMY    . COLPOSCOPY      SOCIAL HISTORY: Social History  Tobacco Use  . Smoking status: Never Smoker  . Smokeless tobacco: Never Used  Substance Use Topics  . Alcohol use: No  . Drug use: No    FAMILY HISTORY: Family History  Problem Relation Age of Onset  . Hypertension Mother   . Hypothyroidism Mother   . Diabetes Mother   . Hyperlipidemia Mother   . Gout Mother   . Sleep apnea Mother   . Cancer Father        colon  . Hypertension Father   . Cancer Maternal Aunt   . Diabetes Paternal Aunt   . Cancer Maternal Grandmother     ROS: Review of Systems  Constitutional: Positive for weight loss.  Gastrointestinal: Negative for diarrhea, nausea and vomiting.  Musculoskeletal:       Negative muscle weakness    Endo/Heme/Allergies:       Negative hypoglycemia Negative polyphagia    PHYSICAL EXAM: Blood pressure 132/86, pulse 72, temperature 97.8 F (36.6 C), temperature source Oral, height 5\' 6"  (1.676 m), weight 207 lb (93.9 kg), SpO2 99 %, unknown if currently breastfeeding. Body mass index is 33.41 kg/m. Physical Exam  Constitutional: She is oriented to person, place, and time. She appears well-developed and well-nourished.  Cardiovascular: Normal rate.  Pulmonary/Chest: Effort normal.  Musculoskeletal: Normal range of motion.  Neurological: She is oriented to person, place, and time.  Skin: Skin is warm and dry.  Psychiatric: She has a normal mood and affect. Her behavior is normal.  Vitals reviewed.   RECENT LABS AND TESTS: BMET    Component Value Date/Time   NA 138 05/05/2018 0914   K 3.6 05/05/2018 0914   CL 98 05/05/2018 0914   CO2 22 05/05/2018 0914   GLUCOSE 107 (H) 05/05/2018 0914   GLUCOSE 118 (H) 09/01/2016 0646   BUN 11 05/05/2018 0914   CREATININE 0.79 05/05/2018 0914   CALCIUM 9.3 05/05/2018 0914   GFRNONAA 92 05/05/2018 0914   GFRAA 106 05/05/2018 0914   Lab Results  Component Value Date   HGBA1C 6.7 (H) 05/05/2018   Lab Results  Component Value Date   INSULIN 26.5 (H) 05/05/2018   CBC    Component Value Date/Time   WBC 8.0 05/05/2018 0914   WBC 14.3 (H) 09/01/2016 0646   RBC 5.28 05/05/2018 0914   RBC 3.96 09/01/2016 0646   HGB 11.7 05/05/2018 0914   HCT 36.4 05/05/2018 0914   PLT 231 09/01/2016 0646   MCV 69 (L) 05/05/2018 0914   MCH 22.2 (L) 05/05/2018 0914   MCH 24.0 (L) 09/01/2016 0646   MCHC 32.1 05/05/2018 0914   MCHC 34.3 09/01/2016 0646   RDW 15.9 (H) 05/05/2018 0914   LYMPHSABS 2.1 05/05/2018 0914   EOSABS 0.4 05/05/2018 0914   BASOSABS 0.1 05/05/2018 0914   Iron/TIBC/Ferritin/ %Sat No results found for: IRON, TIBC, FERRITIN, IRONPCTSAT Lipid Panel     Component Value Date/Time   CHOL 149 05/05/2018 0914   TRIG 107  05/05/2018 0914   HDL 52 05/05/2018 0914   LDLCALC 76 05/05/2018 0914   Hepatic Function Panel     Component Value Date/Time   PROT 7.1 05/05/2018 0914   ALBUMIN 4.4 05/05/2018 0914   AST 16 05/05/2018 0914   ALT 16 05/05/2018 0914   ALKPHOS 95 05/05/2018 0914   BILITOT 0.3 05/05/2018 0914      Component Value Date/Time   TSH 0.988 05/05/2018 0914   Results for LAKETTA, SODERBERG (MRN [id number]) as of 07/22/2018 17:10  Ref.  Range 05/05/2018 09:14  Vitamin D, 25-Hydroxy Latest Ref Range: 30.0 - 100.0 ng/mL 20.1 (L)   ASSESSMENT AND PLAN: Type 2 diabetes mellitus without complication, without long-term current use of insulin (HCC)  Vitamin D deficiency  Class 1 obesity with serious comorbidity and body mass index (BMI) of 33.0 to 33.9 in adult, unspecified obesity type  PLAN:  Diabetes II Montia has been given extensive diabetes education by myself today including ideal fasting and post-prandial blood glucose readings, individual ideal Hgb A1c goals and hypoglycemia prevention. We discussed the importance of good blood sugar control to decrease the likelihood of diabetic complications such as nephropathy, neuropathy, limb loss, blindness, coronary artery disease, and death. We discussed the importance of intensive lifestyle modification including diet, exercise and weight loss as the first line treatment for diabetes. Sherry Palmer agrees to continue taking metformin, and will continue her diet and she agrees to follow up with our clinic in 2 weeks.  Vitamin D Deficiency Sherry Palmer was informed that low vitamin D levels contributes to fatigue and are associated with obesity, breast, and colon cancer. Sherry Palmer agrees to continue taking prescription Vit D @50 ,000 IU every week and will follow up for routine testing of vitamin D, at least 2-3 times per year. She was informed of the risk of over-replacement of vitamin D and agrees to not increase her dose unless she discusses this with [place] first. Sherry Palmer agrees  to follow up with our clinic in 2 weeks.  I spent > than 50% of the 15 minute visit on counseling as documented in the note.  Obesity Sherry Palmer is currently in the action stage of change. As such, her goal is to continue with weight loss efforts She has agreed to follow the Category 2 plan Sherry Palmer has been instructed to work up to a goal of 150 minutes of combined cardio and strengthening exercise per week for weight loss and overall health benefits. We discussed the following Behavioral Modification Strategies today: increasing lean protein intake, holiday eating strategies, increase H20 intake, and planning for success    Sherry Palmer has agreed to follow up with our clinic in 2 weeks. She was informed of the importance of frequent follow up visits to maximize her success with intensive lifestyle modifications for her multiple health conditions.   OBESITY BEHAVIORAL INTERVENTION VISIT  Today's visit was # 6  Starting weight: 217 lbs Starting date: 05/05/18 Today's weight : 207 lbs Today's date: 07/20/2018 Total lbs lost to date: 10    ASK: We discussed the diagnosis of obesity with Sherry Palmer today and Sherry Palmer agreed to give [place] permission to discuss obesity behavioral modification therapy today.  ASSESS: Sherry Palmer has the diagnosis of obesity and her BMI today is 33.43 Sherry Palmer is in the action stage of change   ADVISE: Sherry Palmer was educated on the multiple health risks of obesity as well as the benefit of weight loss to improve her health. She was advised of the need for long term treatment and the importance of lifestyle modifications to improve her current health and to decrease her risk of future health problems.  AGREE: Multiple dietary modification options and treatment options were discussed and  Sherry Palmer agreed to follow the recommendations documented in the above note.  ARRANGE: Sherry Palmer was educated on the importance of frequent visits to treat obesity as outlined per CMS and USPSTF  guidelines and agreed to schedule her next follow up appointment today.  Sherry Palmer, am acting as Location manager for [employer], FNP-C.  I have  reviewed the above documentation for accuracy and completeness, and I agree with the above.  - Sherry Wassink, FNP-C.

## 2018-07-26 ENCOUNTER — Encounter (INDEPENDENT_AMBULATORY_CARE_PROVIDER_SITE_OTHER): Payer: Self-pay | Admitting: Family Medicine

## 2018-08-02 ENCOUNTER — Other Ambulatory Visit (INDEPENDENT_AMBULATORY_CARE_PROVIDER_SITE_OTHER): Payer: Self-pay | Admitting: Family Medicine

## 2018-08-02 DIAGNOSIS — E559 Vitamin D deficiency, unspecified: Secondary | ICD-10-CM

## 2018-08-05 ENCOUNTER — Encounter (INDEPENDENT_AMBULATORY_CARE_PROVIDER_SITE_OTHER): Payer: Self-pay | Admitting: Family Medicine

## 2018-08-05 ENCOUNTER — Ambulatory Visit (INDEPENDENT_AMBULATORY_CARE_PROVIDER_SITE_OTHER): Payer: 59 | Admitting: Family Medicine

## 2018-08-05 VITALS — BP 130/82 | HR 65 | Temp 98.3°F | Ht 66.0 in | Wt 207.0 lb

## 2018-08-05 DIAGNOSIS — Z6833 Body mass index (BMI) 33.0-33.9, adult: Secondary | ICD-10-CM | POA: Diagnosis not present

## 2018-08-05 DIAGNOSIS — E669 Obesity, unspecified: Secondary | ICD-10-CM | POA: Diagnosis not present

## 2018-08-05 DIAGNOSIS — E119 Type 2 diabetes mellitus without complications: Secondary | ICD-10-CM | POA: Diagnosis not present

## 2018-08-09 ENCOUNTER — Encounter (INDEPENDENT_AMBULATORY_CARE_PROVIDER_SITE_OTHER): Payer: Self-pay | Admitting: Family Medicine

## 2018-08-09 NOTE — Progress Notes (Signed)
Office: [phone]  /  Fax: [phone]   HPI:   Chief Complaint: OBESITY Sherry Palmer is here to discuss her progress with her obesity treatment plan. She is on the  follow the Category 2 plan and is following her eating plan approximately 70 % of the time. She states she is exercising by doing cardio and weights for 60 minutes 2 times per week. [name] was off track for Thanksgiving. She is happy she did not gain weight.  Her weight is 207 lb (93.9 kg) today and she has maintained her weight since her last visit. She has lost 10 lbs since starting treatment with [place].  Diabetes II Sherry Palmer has a diagnosis of diabetes type II. Sherry Palmer states fasting BGs range between 115 and 130 and 2 hour postprandial BGs range between 100 and 110. She  denies any hypoglycemic episodes, nausea/vomiting, and diarrhea. Last A1c was 6.7.  She has been working on intensive lifestyle modifications including diet, exercise, and weight loss to help control her blood glucose levels. She is on metformin.   ALLERGIES: Allergies  Allergen Reactions  . Amoxicillin Hives and Other (See Comments)    Has patient had a PCN reaction causing immediate rash, facial/tongue/throat swelling, SOB or lightheadedness with hypotension: No Has patient had a PCN reaction causing severe rash involving mucus membranes or skin necrosis: No Has patient had a PCN reaction that required hospitalization No Has patient had a PCN reaction occurring within the last 10 years: No If all of the above answers are "NO", then may proceed with Cephalosporin use.    MEDICATIONS: Current Outpatient Medications on File Prior to Visit  Medication Sig Dispense Refill  . ferrous sulfate 325 (65 FE) MG tablet Take 325 mg by mouth daily with breakfast.    . folic acid (FOLVITE) 1 MG tablet Take 1 mg by mouth daily.    . hydrochlorothiazide (HYDRODIURIL) 25 MG tablet Take 25 mg by mouth daily. Take 1/2 tab daily    . labetalol (NORMODYNE) 100 MG tablet Take 100  mg by mouth once.     Marland Kitchen levothyroxine (LEVOTHROID) 137 MCG tablet Take 137 mcg by mouth daily before breakfast.    . losartan (COZAAR) 100 MG tablet Take 100 mg by mouth daily.    . metFORMIN (GLUCOPHAGE) 500 MG tablet Take 1,000 mg by mouth 2 (two) times daily with a meal.     . Vitamin D, Ergocalciferol, (DRISDOL) 50000 units CAPS capsule Take 1 capsule (50,000 Units total) by mouth every 7 (seven) days. 4 capsule 0   No current facility-administered medications on file prior to visit.     PAST MEDICAL HISTORY: Past Medical History:  Diagnosis Date  . Anemia   . Back pain   . Diabetes mellitus without complication ([place])    type 2  . Dyspnea   . Gallbladder problem   . Hypertension   . Hypothyroidism   . Joint pain   . Obesity   . Postpartum care following vaginal delivery (12/31) 09/01/2016  . Sleep apnea   . Vaginal Pap smear, abnormal     PAST SURGICAL HISTORY: Past Surgical History:  Procedure Laterality Date  . BUNIONECTOMY  2009  . CHOLECYSTECTOMY    . COLPOSCOPY      SOCIAL HISTORY: Social History   Tobacco Use  . Smoking status: Never Smoker  . Smokeless tobacco: Never Used  Substance Use Topics  . Alcohol use: No  . Drug use: No    FAMILY HISTORY: Family History  Problem  Relation Age of Onset  . Hypertension Mother   . Hypothyroidism Mother   . Diabetes Mother   . Hyperlipidemia Mother   . Gout Mother   . Sleep apnea Mother   . Cancer Father        colon  . Hypertension Father   . Cancer Maternal Aunt   . Diabetes Paternal Aunt   . Cancer Maternal Grandmother     ROS: Review of Systems  Constitutional: Positive for weight loss.  Gastrointestinal: Negative for diarrhea, nausea and vomiting.  Endo/Heme/Allergies:       Negative for hypoglycemia    PHYSICAL EXAM: Blood pressure 130/82, pulse 65, temperature 98.3 F (36.8 C), temperature source Oral, height 5\' 6"  (1.676 m), weight 207 lb (93.9 kg), SpO2 100 %, unknown if currently  breastfeeding. Body mass index is 33.41 kg/m. Physical Exam  Constitutional: She is oriented to person, place, and time. She appears well-developed and well-nourished.  HENT:  Head: Normocephalic.  Eyes: Pupils are equal, round, and reactive to light.  Neck: Normal range of motion.  Cardiovascular: Normal rate.  Pulmonary/Chest: Effort normal.  Musculoskeletal: Normal range of motion.  Neurological: She is alert and oriented to person, place, and time.  Skin: Skin is warm and dry.  Psychiatric: She has a normal mood and affect. Her behavior is normal.  Vitals reviewed.   RECENT LABS AND TESTS: BMET    Component Value Date/Time   NA 138 05/05/2018 0914   K 3.6 05/05/2018 0914   CL 98 05/05/2018 0914   CO2 22 05/05/2018 0914   GLUCOSE 107 (H) 05/05/2018 0914   GLUCOSE 118 (H) 09/01/2016 0646   BUN 11 05/05/2018 0914   CREATININE 0.79 05/05/2018 0914   CALCIUM 9.3 05/05/2018 0914   GFRNONAA 92 05/05/2018 0914   GFRAA 106 05/05/2018 0914   Lab Results  Component Value Date   HGBA1C 6.7 (H) 05/05/2018   Lab Results  Component Value Date   INSULIN 26.5 (H) 05/05/2018   CBC    Component Value Date/Time   WBC 8.0 05/05/2018 0914   WBC 14.3 (H) 09/01/2016 0646   RBC 5.28 05/05/2018 0914   RBC 3.96 09/01/2016 0646   HGB 11.7 05/05/2018 0914   HCT 36.4 05/05/2018 0914   PLT 231 09/01/2016 0646   MCV 69 (L) 05/05/2018 0914   MCH 22.2 (L) 05/05/2018 0914   MCH 24.0 (L) 09/01/2016 0646   MCHC 32.1 05/05/2018 0914   MCHC 34.3 09/01/2016 0646   RDW 15.9 (H) 05/05/2018 0914   LYMPHSABS 2.1 05/05/2018 0914   EOSABS 0.4 05/05/2018 0914   BASOSABS 0.1 05/05/2018 0914   Iron/TIBC/Ferritin/ %Sat No results found for: IRON, TIBC, FERRITIN, IRONPCTSAT Lipid Panel     Component Value Date/Time   CHOL 149 05/05/2018 0914   TRIG 107 05/05/2018 0914   HDL 52 05/05/2018 0914   LDLCALC 76 05/05/2018 0914   Hepatic Function Panel     Component Value Date/Time   PROT 7.1  05/05/2018 0914   ALBUMIN 4.4 05/05/2018 0914   AST 16 05/05/2018 0914   ALT 16 05/05/2018 0914   ALKPHOS 95 05/05/2018 0914   BILITOT 0.3 05/05/2018 0914      Component Value Date/Time   TSH 0.988 05/05/2018 0914    ASSESSMENT AND PLAN: Type 2 diabetes mellitus without complication, without long-term current use of insulin (HCC)  Class 1 obesity with serious comorbidity and body mass index (BMI) of 33.0 to 33.9 in adult, unspecified obesity type  PLAN:  Diabetes II Arietta has been given extensive diabetes education by myself today including ideal fasting and post-prandial blood glucose readings, individual ideal HgA1c goals  and hypoglycemia prevention. We discussed the importance of good blood sugar control to decrease the likelihood of diabetic complications such as nephropathy, neuropathy, limb loss, blindness, coronary artery disease, and death. We discussed the importance of intensive lifestyle modification including diet, exercise and weight loss as the first line treatment for diabetes. Renuka agrees to continue her diabetes medications and will follow up at the agreed upon time. We will repeat A1c at next visit.   I spent > than 50% of the 15 minute visit on counseling as documented in the note.  Obesity Isadora is currently in the action stage of change. As such, her goal is to continue with weight loss efforts She has agreed to keep a food journal with 400-500 calories and 35+g of protein for supper and follow the Category 2 plan Albertha has been instructed to continue cardio and weights for 60 minutes 2 times per week for weight loss and overall health benefits. We discussed the following Behavioral Modification Strategies today: increasing lean protein intake, planning for success, keeping a strict food journal, and work on meal planning and easy cooking plans   Jannelly has agreed to follow up with our clinic in 2 weeks. She was informed of the importance of frequent follow up  visits to maximize her success with intensive lifestyle modifications for her multiple health conditions.   OBESITY BEHAVIORAL INTERVENTION VISIT  Today's visit was # 7   Starting weight: 217 lb Starting date: 05/05/18 Today's weight : Weight: 207 lb (93.9 kg)  Today's date: 08/05/18 Total lbs lost to date: 10 lb At least 15 minutes were spent on discussing the following behavioral intervention visit.   ASK: We discussed the diagnosis of obesity with Yolande Jolly today and Anvitha agreed to give [place] permission to discuss obesity behavioral modification therapy today.  ASSESS: Dezeray has the diagnosis of obesity and her BMI today is 33.43 Shanay is in the action stage of change   ADVISE: Delva was educated on the multiple health risks of obesity as well as the benefit of weight loss to improve her health. She was advised of the need for long term treatment and the importance of lifestyle modifications to improve her current health and to decrease her risk of future health problems.  AGREE: Multiple dietary modification options and treatment options were discussed and  Tkeya agreed to follow the recommendations documented in the above note.  ARRANGE: Aleli was educated on the importance of frequent visits to treat obesity as outlined per CMS and USPSTF guidelines and agreed to schedule her next follow up appointment today.  I, Renee Ramus, am acting as Location manager for [employer], FNP-C.  I have reviewed the above documentation for accuracy and completeness, and I agree with the above.  - Rayvin Abid, FNP-C.

## 2018-08-18 ENCOUNTER — Ambulatory Visit (INDEPENDENT_AMBULATORY_CARE_PROVIDER_SITE_OTHER): Payer: 59 | Admitting: Family Medicine

## 2018-08-18 ENCOUNTER — Encounter (INDEPENDENT_AMBULATORY_CARE_PROVIDER_SITE_OTHER): Payer: Self-pay | Admitting: Family Medicine

## 2018-08-18 VITALS — BP 137/83 | HR 68 | Temp 97.8°F | Ht 66.0 in | Wt 210.0 lb

## 2018-08-18 DIAGNOSIS — E669 Obesity, unspecified: Secondary | ICD-10-CM

## 2018-08-18 DIAGNOSIS — E119 Type 2 diabetes mellitus without complications: Secondary | ICD-10-CM

## 2018-08-18 DIAGNOSIS — Z6833 Body mass index (BMI) 33.0-33.9, adult: Secondary | ICD-10-CM | POA: Diagnosis not present

## 2018-08-19 ENCOUNTER — Encounter (INDEPENDENT_AMBULATORY_CARE_PROVIDER_SITE_OTHER): Payer: Self-pay | Admitting: Family Medicine

## 2018-08-19 DIAGNOSIS — E66811 Obesity, class 1: Secondary | ICD-10-CM | POA: Insufficient documentation

## 2018-08-19 DIAGNOSIS — Z6833 Body mass index (BMI) 33.0-33.9, adult: Secondary | ICD-10-CM

## 2018-08-19 DIAGNOSIS — E669 Obesity, unspecified: Secondary | ICD-10-CM | POA: Insufficient documentation

## 2018-08-19 NOTE — Progress Notes (Signed)
Office: [phone]  /  Fax: [phone]   HPI:   Chief Complaint: OBESITY Sherry Palmer is here to discuss her progress with her obesity treatment plan. She is on the keep a food journal with 400-500 calories and 35+ grams of protein at supper daily and follow the Category 2 plan and is following her eating plan approximately 75% of the time. She states she is doing cardio and strength training for 60 minutes 2 times per week. Sherry Palmer has had several celebrations that are work related recently and also celebrated her birthday. She is trying to make good choices.  Her weight is 210 lb (95.3 kg) today and has gained 3 pounds since her last visit. She has lost 7 lbs since starting treatment with [place].  Diabetes II Sherry Palmer has a diagnosis of diabetes type II, well controlled. Last A1c was 6.7 on 05/05/18. She is on metformin and she denies nausea, vomiting, or diarrhea. Sherry Palmer states fasting BGs range between 120 and 130 and 2 hour post prandial range between 115 and 120 and she denies hypoglycemia. She has been working on intensive lifestyle modifications including diet, exercise, and weight loss to help control her blood glucose levels.  ALLERGIES: Allergies  Allergen Reactions  . Amoxicillin Hives and Other (See Comments)    Has patient had a PCN reaction causing immediate rash, facial/tongue/throat swelling, SOB or lightheadedness with hypotension: No Has patient had a PCN reaction causing severe rash involving mucus membranes or skin necrosis: No Has patient had a PCN reaction that required hospitalization No Has patient had a PCN reaction occurring within the last 10 years: No If all of the above answers are "NO", then may proceed with Cephalosporin use.    MEDICATIONS: Current Outpatient Medications on File Prior to Visit  Medication Sig Dispense Refill  . ferrous sulfate 325 (65 FE) MG tablet Take 325 mg by mouth daily with breakfast.    . folic acid (FOLVITE) 1 MG tablet Take 1 mg by mouth  daily.    . hydrochlorothiazide (HYDRODIURIL) 25 MG tablet Take 25 mg by mouth daily. Take 1/2 tab daily    . labetalol (NORMODYNE) 100 MG tablet Take 100 mg by mouth once.     Marland Kitchen levothyroxine (LEVOTHROID) 137 MCG tablet Take 137 mcg by mouth daily before breakfast.    . losartan (COZAAR) 100 MG tablet Take 100 mg by mouth daily.    . metFORMIN (GLUCOPHAGE) 500 MG tablet Take 1,000 mg by mouth 2 (two) times daily with a meal.     . Vitamin D, Ergocalciferol, (DRISDOL) 50000 units CAPS capsule Take 1 capsule (50,000 Units total) by mouth every 7 (seven) days. 4 capsule 0   No current facility-administered medications on file prior to visit.     PAST MEDICAL HISTORY: Past Medical History:  Diagnosis Date  . Anemia   . Back pain   . Diabetes mellitus without complication ([place])    type 2  . Dyspnea   . Gallbladder problem   . Hypertension   . Hypothyroidism   . Joint pain   . Obesity   . Postpartum care following vaginal delivery (12/31) 09/01/2016  . Sleep apnea   . Vaginal Pap smear, abnormal     PAST SURGICAL HISTORY: Past Surgical History:  Procedure Laterality Date  . BUNIONECTOMY  2009  . CHOLECYSTECTOMY    . COLPOSCOPY      SOCIAL HISTORY: Social History   Tobacco Use  . Smoking status: Never Smoker  . Smokeless tobacco: Never  Used  Substance Use Topics  . Alcohol use: No  . Drug use: No    FAMILY HISTORY: Family History  Problem Relation Age of Onset  . Hypertension Mother   . Hypothyroidism Mother   . Diabetes Mother   . Hyperlipidemia Mother   . Gout Mother   . Sleep apnea Mother   . Cancer Father        colon  . Hypertension Father   . Cancer Maternal Aunt   . Diabetes Paternal Aunt   . Cancer Maternal Grandmother     ROS: Review of Systems  Constitutional: Negative for weight loss.  Gastrointestinal: Negative for diarrhea, nausea and vomiting.  Endo/Heme/Allergies:       Negative hypoglycemia    PHYSICAL EXAM: Blood pressure 137/83,  pulse 68, temperature 97.8 F (36.6 C), temperature source Oral, height 5\' 6"  (1.676 m), weight 210 lb (95.3 kg), SpO2 98 %, unknown if currently breastfeeding. Body mass index is 33.89 kg/m. Physical Exam Vitals signs reviewed.  Constitutional:      Appearance: Normal appearance. She is obese.  Cardiovascular:     Rate and Rhythm: Normal rate.     Pulses: Normal pulses.  Pulmonary:     Effort: Pulmonary effort is normal.  Musculoskeletal: Normal range of motion.  Skin:    General: Skin is warm and dry.  Neurological:     Mental Status: She is alert and oriented to person, place, and time.  Psychiatric:        Mood and Affect: Mood normal.        Behavior: Behavior normal.     RECENT LABS AND TESTS: BMET    Component Value Date/Time   NA 138 05/05/2018 0914   K 3.6 05/05/2018 0914   CL 98 05/05/2018 0914   CO2 22 05/05/2018 0914   GLUCOSE 107 (H) 05/05/2018 0914   GLUCOSE 118 (H) 09/01/2016 0646   BUN 11 05/05/2018 0914   CREATININE 0.79 05/05/2018 0914   CALCIUM 9.3 05/05/2018 0914   GFRNONAA 92 05/05/2018 0914   GFRAA 106 05/05/2018 0914   Lab Results  Component Value Date   HGBA1C 6.7 (H) 05/05/2018   Lab Results  Component Value Date   INSULIN 26.5 (H) 05/05/2018   CBC    Component Value Date/Time   WBC 8.0 05/05/2018 0914   WBC 14.3 (H) 09/01/2016 0646   RBC 5.28 05/05/2018 0914   RBC 3.96 09/01/2016 0646   HGB 11.7 05/05/2018 0914   HCT 36.4 05/05/2018 0914   PLT 231 09/01/2016 0646   MCV 69 (L) 05/05/2018 0914   MCH 22.2 (L) 05/05/2018 0914   MCH 24.0 (L) 09/01/2016 0646   MCHC 32.1 05/05/2018 0914   MCHC 34.3 09/01/2016 0646   RDW 15.9 (H) 05/05/2018 0914   LYMPHSABS 2.1 05/05/2018 0914   EOSABS 0.4 05/05/2018 0914   BASOSABS 0.1 05/05/2018 0914   Iron/TIBC/Ferritin/ %Sat No results found for: IRON, TIBC, FERRITIN, IRONPCTSAT Lipid Panel     Component Value Date/Time   CHOL 149 05/05/2018 0914   TRIG 107 05/05/2018 0914   HDL 52  05/05/2018 0914   LDLCALC 76 05/05/2018 0914   Hepatic Function Panel     Component Value Date/Time   PROT 7.1 05/05/2018 0914   ALBUMIN 4.4 05/05/2018 0914   AST 16 05/05/2018 0914   ALT 16 05/05/2018 0914   ALKPHOS 95 05/05/2018 0914   BILITOT 0.3 05/05/2018 0914      Component Value Date/Time   TSH 0.988  05/05/2018 0914    ASSESSMENT AND PLAN: Type 2 diabetes mellitus without complication, without long-term current use of insulin (HCC)  Class 1 obesity with serious comorbidity and body mass index (BMI) of 34.0 to 34.9 in adult, unspecified obesity type  PLAN:  Diabetes II Armentha has been given extensive diabetes education by myself today including ideal fasting and post-prandial blood glucose readings, individual ideal Hgb A1c goals and hypoglycemia prevention. We discussed the importance of good blood sugar control to decrease the likelihood of diabetic complications such as nephropathy, neuropathy, limb loss, blindness, coronary artery disease, and death. We discussed the importance of intensive lifestyle modification including diet, exercise and weight loss as the first line treatment for diabetes. Rhythm agrees to continue taking metformin and we will recheck A1c at next visit. Maleny agrees to follow up with our clinic in 3 weeks.  I spent > than 50% of the 15 minute visit on counseling as documented in the note.  Obesity Sherry Palmer is currently in the action stage of change. As such, her goal is to continue with weight loss efforts She has agreed to keep a food journal with 400-500 calories and 35+ grams of protein at supper daily and follow the Category 2 plan Sherry Palmer will continue current exercise regimen for weight loss and overall health benefits. We discussed the following Behavioral Modification Strategies today: celebration eating strategies and planning for success   Sherry Palmer has agreed to follow up with our clinic in 3 weeks. She was informed of the importance of frequent  follow up visits to maximize her success with intensive lifestyle modifications for her multiple health conditions.   OBESITY BEHAVIORAL INTERVENTION VISIT  Today's visit was # 8  Starting weight: 217 lbs Starting date: 05/05/18 Today's weight : 210 lbs  Today's date: 08/18/2018 Total lbs lost to date: 7    ASK: We discussed the diagnosis of obesity with Sherry Palmer today and Sherry Palmer agreed to give [place] permission to discuss obesity behavioral modification therapy today.  ASSESS: Sherry Palmer has the diagnosis of obesity and her BMI today is 33.91 Sherry Palmer is in the action stage of change   ADVISE: Timmia was educated on the multiple health risks of obesity as well as the benefit of weight loss to improve her health. She was advised of the need for long term treatment and the importance of lifestyle modifications to improve her current health and to decrease her risk of future health problems.  AGREE: Multiple dietary modification options and treatment options were discussed and  Sherry Palmer agreed to follow the recommendations documented in the above note.  ARRANGE: Sherry Palmer was educated on the importance of frequent visits to treat obesity as outlined per CMS and USPSTF guidelines and agreed to schedule her next follow up appointment today.  Wilhemena Durie, am acting as Location manager for [employer], FNP-C.  I have reviewed the above documentation for accuracy and completeness, and I agree with the above.  - Armoni Depass, FNP-C.

## 2018-09-02 ENCOUNTER — Other Ambulatory Visit (INDEPENDENT_AMBULATORY_CARE_PROVIDER_SITE_OTHER): Payer: Self-pay

## 2018-09-02 DIAGNOSIS — E559 Vitamin D deficiency, unspecified: Secondary | ICD-10-CM

## 2018-09-02 MED ORDER — VITAMIN D (ERGOCALCIFEROL) 1.25 MG (50000 UNIT) PO CAPS: 50000.0000 [IU] | ORAL_CAPSULE | ORAL | 0 refills | Status: DC

## 2018-09-09 ENCOUNTER — Encounter (INDEPENDENT_AMBULATORY_CARE_PROVIDER_SITE_OTHER): Payer: Self-pay | Admitting: Family Medicine

## 2018-09-09 ENCOUNTER — Ambulatory Visit (INDEPENDENT_AMBULATORY_CARE_PROVIDER_SITE_OTHER): Payer: 59 | Admitting: Family Medicine

## 2018-09-09 VITALS — BP 137/84 | HR 69 | Temp 97.9°F | Ht 66.0 in | Wt 207.0 lb

## 2018-09-09 DIAGNOSIS — E559 Vitamin D deficiency, unspecified: Secondary | ICD-10-CM | POA: Diagnosis not present

## 2018-09-09 DIAGNOSIS — E119 Type 2 diabetes mellitus without complications: Secondary | ICD-10-CM | POA: Diagnosis not present

## 2018-09-09 DIAGNOSIS — E669 Obesity, unspecified: Secondary | ICD-10-CM | POA: Diagnosis not present

## 2018-09-09 DIAGNOSIS — Z6833 Body mass index (BMI) 33.0-33.9, adult: Secondary | ICD-10-CM

## 2018-09-09 DIAGNOSIS — Z9189 Other specified personal risk factors, not elsewhere classified: Secondary | ICD-10-CM | POA: Diagnosis not present

## 2018-09-09 MED ORDER — VITAMIN D (ERGOCALCIFEROL) 1.25 MG (50000 UNIT) PO CAPS: 50000.0000 [IU] | ORAL_CAPSULE | ORAL | 0 refills | Status: DC

## 2018-09-10 LAB — COMPREHENSIVE METABOLIC PANEL
ALT: 11 IU/L (ref 0–32)
AST: 12 IU/L (ref 0–40)
Albumin/Globulin Ratio: 1.5 (ref 1.2–2.2)
Albumin: 4 g/dL (ref 3.5–5.5)
Alkaline Phosphatase: 84 IU/L (ref 39–117)
BUN/Creatinine Ratio: 11 (ref 9–23)
BUN: 9 mg/dL (ref 6–24)
Bilirubin Total: 0.4 mg/dL (ref 0.0–1.2)
CO2: 21 mmol/L (ref 20–29)
Calcium: 9.4 mg/dL (ref 8.7–10.2)
Chloride: 101 mmol/L (ref 96–106)
Creatinine, Ser: 0.83 mg/dL (ref 0.57–1.00)
GFR calc Af Amer: 99 mL/min/{1.73_m2} (ref >59)
GFR calc non Af Amer: 86 mL/min/{1.73_m2} (ref >59)
Globulin, Total: 2.7 g/dL (ref 1.5–4.5)
Glucose: 87 mg/dL (ref 65–99)
Potassium: 4 mmol/L (ref 3.5–5.2)
Sodium: 139 mmol/L (ref 134–144)
Total Protein: 6.7 g/dL (ref 6.0–8.5)

## 2018-09-10 LAB — VITAMIN D 25 HYDROXY (VIT D DEFICIENCY, FRACTURES): Vit D, 25-Hydroxy: 52.1 ng/mL (ref 30.0–100.0)

## 2018-09-10 LAB — HEMOGLOBIN A1C
Est. average glucose Bld gHb Est-mCnc: 134 mg/dL
Hgb A1c MFr Bld: 6.3 % — ABNORMAL HIGH (ref 4.8–5.6)

## 2018-09-11 NOTE — Progress Notes (Signed)
Office: [phone]  /  Fax: [phone]   HPI:   Chief Complaint: OBESITY Sherry Palmer is here to discuss her progress with her obesity treatment plan. She is on the keep a food journal with 400-500 calories and 35+ grams of protein at supper daily and follow the Category 2 plan and is following her eating plan approximately 50% of the time. She states she is exercising 0 minutes 0 times per week. Sherry Palmer is excited about her weight loss because she has indulged over the holidays and wasn't expecting to lose weight. She plans to get back on the plan tomorrow. She needs to shop for groceries.  Her weight is 207 lb (93.9 kg) today and has had a weight loss of 3 pounds over a period of 3 weeks since her last visit. She has lost 10 lbs since starting treatment with [place].  Vitamin D Deficiency Sherry Palmer has a diagnosis of vitamin D deficiency. She is currently taking prescription Vit D, but level not at goal. Last Vit D level was 20.1. She denies nausea, vomiting or muscle weakness.  At risk for osteopenia and osteoporosis Sherry Palmer is at higher risk of osteopenia and osteoporosis due to vitamin D deficiency.   Diabetes II Sherry Palmer has a diagnosis of diabetes type II. Sherry Palmer's diabetes is well controlled on metformin. She denies hypoglycemia. Last A1c was 6.3. She has been working on intensive lifestyle modifications including diet, exercise, and weight loss to help control her blood glucose levels.  ALLERGIES: Allergies  Allergen Reactions  . Amoxicillin Hives and Other (See Comments)    Has patient had a PCN reaction causing immediate rash, facial/tongue/throat swelling, SOB or lightheadedness with hypotension: No Has patient had a PCN reaction causing severe rash involving mucus membranes or skin necrosis: No Has patient had a PCN reaction that required hospitalization No Has patient had a PCN reaction occurring within the last 10 years: No If all of the above answers are "NO", then may proceed with  Cephalosporin use.    MEDICATIONS: Current Outpatient Medications on File Prior to Visit  Medication Sig Dispense Refill  . ferrous sulfate 325 (65 FE) MG tablet Take 325 mg by mouth daily with breakfast.    . folic acid (FOLVITE) 1 MG tablet Take 1 mg by mouth daily.    . hydrochlorothiazide (HYDRODIURIL) 25 MG tablet Take 25 mg by mouth daily. Take 1/2 tab daily    . labetalol (NORMODYNE) 100 MG tablet Take 100 mg by mouth once.     Marland Kitchen levothyroxine (LEVOTHROID) 137 MCG tablet Take 137 mcg by mouth daily before breakfast.    . losartan (COZAAR) 100 MG tablet Take 100 mg by mouth daily.    . metFORMIN (GLUCOPHAGE) 500 MG tablet Take 1,000 mg by mouth 2 (two) times daily with a meal.      No current facility-administered medications on file prior to visit.     PAST MEDICAL HISTORY: Past Medical History:  Diagnosis Date  . Anemia   . Back pain   . Diabetes mellitus without complication ([place])    type 2  . Dyspnea   . Gallbladder problem   . Hypertension   . Hypothyroidism   . Joint pain   . Obesity   . Postpartum care following vaginal delivery (12/31) 09/01/2016  . Sleep apnea   . Vaginal Pap smear, abnormal     PAST SURGICAL HISTORY: Past Surgical History:  Procedure Laterality Date  . BUNIONECTOMY  2009  . CHOLECYSTECTOMY    . COLPOSCOPY  SOCIAL HISTORY: Social History   Tobacco Use  . Smoking status: Never Smoker  . Smokeless tobacco: Never Used  Substance Use Topics  . Alcohol use: No  . Drug use: No    FAMILY HISTORY: Family History  Problem Relation Age of Onset  . Hypertension Mother   . Hypothyroidism Mother   . Diabetes Mother   . Hyperlipidemia Mother   . Gout Mother   . Sleep apnea Mother   . Cancer Father        colon  . Hypertension Father   . Cancer Maternal Aunt   . Diabetes Paternal Aunt   . Cancer Maternal Grandmother     ROS: Review of Systems  Constitutional: Positive for weight loss.  Gastrointestinal: Negative for nausea  and vomiting.  Musculoskeletal:       Negative muscle weakness  Endo/Heme/Allergies:       Negative hypoglycemia    PHYSICAL EXAM: Blood pressure 137/84, pulse 69, temperature 97.9 F (36.6 C), temperature source Oral, height 5\' 6"  (1.676 m), weight 207 lb (93.9 kg), SpO2 100 %, unknown if currently breastfeeding. Body mass index is 33.41 kg/m. Physical Exam Vitals signs reviewed.  Constitutional:      Appearance: Normal appearance. She is obese.  Cardiovascular:     Rate and Rhythm: Normal rate.     Pulses: Normal pulses.  Pulmonary:     Effort: Pulmonary effort is normal.     Breath sounds: Normal breath sounds.  Musculoskeletal: Normal range of motion.  Skin:    General: Skin is warm and dry.  Neurological:     Mental Status: She is alert and oriented to person, place, and time.  Psychiatric:        Mood and Affect: Mood normal.        Behavior: Behavior normal.     RECENT LABS AND TESTS: BMET    Component Value Date/Time   NA 139 09/09/2018 1200   K 4.0 09/09/2018 1200   CL 101 09/09/2018 1200   CO2 21 09/09/2018 1200   GLUCOSE 87 09/09/2018 1200   GLUCOSE 118 (H) 09/01/2016 0646   BUN 9 09/09/2018 1200   CREATININE 0.83 09/09/2018 1200   CALCIUM 9.4 09/09/2018 1200   GFRNONAA 86 09/09/2018 1200   GFRAA 99 09/09/2018 1200   Lab Results  Component Value Date   HGBA1C 6.3 (H) 09/09/2018   HGBA1C 6.7 (H) 05/05/2018   Lab Results  Component Value Date   INSULIN 26.5 (H) 05/05/2018   CBC    Component Value Date/Time   WBC 8.0 05/05/2018 0914   WBC 14.3 (H) 09/01/2016 0646   RBC 5.28 05/05/2018 0914   RBC 3.96 09/01/2016 0646   HGB 11.7 05/05/2018 0914   HCT 36.4 05/05/2018 0914   PLT 231 09/01/2016 0646   MCV 69 (L) 05/05/2018 0914   MCH 22.2 (L) 05/05/2018 0914   MCH 24.0 (L) 09/01/2016 0646   MCHC 32.1 05/05/2018 0914   MCHC 34.3 09/01/2016 0646   RDW 15.9 (H) 05/05/2018 0914   LYMPHSABS 2.1 05/05/2018 0914   EOSABS 0.4 05/05/2018 0914    BASOSABS 0.1 05/05/2018 0914   Iron/TIBC/Ferritin/ %Sat No results found for: IRON, TIBC, FERRITIN, IRONPCTSAT Lipid Panel     Component Value Date/Time   CHOL 149 05/05/2018 0914   TRIG 107 05/05/2018 0914   HDL 52 05/05/2018 0914   LDLCALC 76 05/05/2018 0914   Hepatic Function Panel     Component Value Date/Time   PROT 6.7 09/09/2018  1200   ALBUMIN 4.0 09/09/2018 1200   AST 12 09/09/2018 1200   ALT 11 09/09/2018 1200   ALKPHOS 84 09/09/2018 1200   BILITOT 0.4 09/09/2018 1200      Component Value Date/Time   TSH 0.988 05/05/2018 0914    ASSESSMENT AND PLAN: Vitamin D deficiency - Plan: VITAMIN D 25 Hydroxy (Vit-D Deficiency, Fractures), Vitamin D, Ergocalciferol, (DRISDOL) 1.25 MG (50000 UT) CAPS capsule  Type 2 diabetes mellitus without complication, without long-term current use of insulin (HCC) - Plan: Comprehensive metabolic panel, Hemoglobin A1c  At risk for osteoporosis  Class 1 obesity with serious comorbidity and body mass index (BMI) of 33.0 to 33.9 in adult, unspecified obesity type  PLAN:  Vitamin D Deficiency Sherry Palmer was informed that low vitamin D levels contributes to fatigue and are associated with obesity, breast, and colon cancer. Sherry Palmer agrees to continue taking prescription Vit D @50 ,000 IU every week #4 and we will refill for 1 month. She will follow up for routine testing of vitamin D, at least 2-3 times per year. She was informed of the risk of over-replacement of vitamin D and agrees to not increase her dose unless she discusses this with [place] first. We will check Vit D level today. Sherry Palmer agrees to follow up with our clinic in 2 weeks.  At risk for osteopenia and osteoporosis Sherry Palmer was given extended (15 minutes) osteoporosis prevention counseling today. Sherry Palmer is at risk for osteopenia and osteoporsis due to her vitamin D deficiency. She was encouraged to take her vitamin D and follow her higher calcium diet and increase strengthening exercise to help  strengthen her bones and decrease her risk of osteopenia and osteoporosis.  Diabetes II Sherry Palmer has been given extensive diabetes education by myself today including ideal fasting and post-prandial blood glucose readings, individual ideal Hgb A1c goals and hypoglycemia prevention. We discussed the importance of good blood sugar control to decrease the likelihood of diabetic complications such as nephropathy, neuropathy, limb loss, blindness, coronary artery disease, and death. We discussed the importance of intensive lifestyle modification including diet, exercise and weight loss as the first line treatment for diabetes. Sherry Palmer agrees to continue taking metformin and continue with meal plan. We will check A1c and fasting glucose today. Sherry Palmer agrees to follow up with our clinic in 2 weeks.  Obesity Sherry Palmer is currently in the action stage of change. As such, her goal is to continue with weight loss efforts She has agreed to keep a food journal with 400-500 calories and 35+ grams of protein at supper daily and follow the Category 2 plan Sherry Palmer has not been prescribed exercise at this time. We discussed the following Behavioral Modification Strategies today: keeping healthy foods in the home and planning for success   Sherry Palmer has agreed to follow up with our clinic in 2 weeks. She was informed of the importance of frequent follow up visits to maximize her success with intensive lifestyle modifications for her multiple health conditions.   OBESITY BEHAVIORAL INTERVENTION VISIT  Today's visit was # 9  Starting weight: 217 lbs Starting date: 05/05/18 Today's weight : 207 lbs  Today's date: 09/09/2018 Total lbs lost to date: 10    ASK: We discussed the diagnosis of obesity with Sherry Palmer today and Sherry Palmer agreed to give [place] permission to discuss obesity behavioral modification therapy today.  ASSESS: Sherry Palmer has the diagnosis of obesity and her BMI today is 33.43 Sherry Palmer is in the action stage of change     ADVISE: Sherry Palmer  was educated on the multiple health risks of obesity as well as the benefit of weight loss to improve her health. She was advised of the need for long term treatment and the importance of lifestyle modifications to improve her current health and to decrease her risk of future health problems.  AGREE: Multiple dietary modification options and treatment options were discussed and  Sherry Palmer agreed to follow the recommendations documented in the above note.  ARRANGE: Sherry Palmer was educated on the importance of frequent visits to treat obesity as outlined per CMS and USPSTF guidelines and agreed to schedule her next follow up appointment today.  Sherry Palmer, am acting as Location manager for [employer], FNP-C.  I have reviewed the above documentation for accuracy and completeness, and I agree with the above.  - Lynzy Rawles, FNP-C.

## 2018-09-16 ENCOUNTER — Encounter (INDEPENDENT_AMBULATORY_CARE_PROVIDER_SITE_OTHER): Payer: Self-pay | Admitting: Family Medicine

## 2018-09-22 ENCOUNTER — Encounter (INDEPENDENT_AMBULATORY_CARE_PROVIDER_SITE_OTHER): Payer: Self-pay | Admitting: Family Medicine

## 2018-09-22 ENCOUNTER — Ambulatory Visit (INDEPENDENT_AMBULATORY_CARE_PROVIDER_SITE_OTHER): Payer: 59 | Admitting: Family Medicine

## 2018-09-22 VITALS — BP 140/82 | HR 66 | Temp 97.9°F | Ht 66.0 in | Wt 208.0 lb

## 2018-09-22 DIAGNOSIS — E559 Vitamin D deficiency, unspecified: Secondary | ICD-10-CM | POA: Diagnosis not present

## 2018-09-22 DIAGNOSIS — Z6833 Body mass index (BMI) 33.0-33.9, adult: Secondary | ICD-10-CM

## 2018-09-22 DIAGNOSIS — Z9189 Other specified personal risk factors, not elsewhere classified: Secondary | ICD-10-CM | POA: Diagnosis not present

## 2018-09-22 DIAGNOSIS — E669 Obesity, unspecified: Secondary | ICD-10-CM

## 2018-09-22 DIAGNOSIS — I10 Essential (primary) hypertension: Secondary | ICD-10-CM

## 2018-09-23 NOTE — Progress Notes (Signed)
Office: [phone]  /  Fax: [phone]   HPI:   Chief Complaint: OBESITY Sherry Palmer is here to discuss her progress with her obesity treatment plan. She is on the keep a food journal with 400-500 calories and 35 grams of protein at supper daily and follow the category 2 plan and is following her eating plan approximately 50 % of the time. She states she is exercising 0 minutes 0 times per week. Sherry Palmer has been sick with pharyngitis and an upper respiratory infection. She has been eating soup but not eating all of the meals on the plan. Her weight is 208 lb (94.3 kg) today and has lost 0 lbs since her last visit. She has lost 9 lbs since starting treatment with [place].  Vitamin D deficiency Sherry Palmer has a diagnosis of vitamin D deficiency. She is currently taking prescription Vit D and denies nausea, vomiting or muscle weakness. Her Vit D level is 52.1, she is at goal.  Hypertension Sherry Palmer is a 43 y.o. female with hypertension.  [name] denies chest pain or shortness of breath. She is working weight loss to help control her blood pressure with the goal of decreasing her risk of heart attack and stroke. [name] blood pressure is slightly elevated today- this is likely related to the upper respiratory infection. She denies chest pain or shortness of breath.  At risk for cardiovascular disease [name] is at a higher than average risk for cardiovascular disease due to obesity. She currently denies any chest pain.  ASSESSMENT AND PLAN:  Vitamin D deficiency  Essential hypertension  At risk for heart disease  Class 1 obesity with serious comorbidity and body mass index (BMI) of 33.0 to 33.9 in adult, unspecified obesity type  PLAN: Vitamin D Deficiency Sherry Palmer was informed that low vitamin D levels contributes to fatigue and are associated with obesity, breast, and colon cancer. She agrees to discontinue to take prescription Vit D and start taking OTC Vit D3 2000 units daily. She will  follow up for routine testing of vitamin D, at least 2-3 times per year. She was informed of the risk of over-replacement of vitamin D and agrees to not increase her dose unless she discusses this with [place] first. Sherry Palmer agrees to follow up with our clinic in 2 weeks.  Hypertension We discussed sodium restriction, working on healthy weight loss, and a regular exercise program as the means to achieve improved blood pressure control. Sherry Palmer agreed with this plan and agreed to follow up as directed. We will continue to monitor her blood pressure as well as her progress with the above lifestyle modifications. Sherry Palmer agrees to continue her medications and meal plan. she will watch for signs of hypotension as she continues her lifestyle modifications. Sherry Palmer agrees to follow up with our clinic in 2 weeks.  Cardiovascular risk counseling Maria was given extended (15 minutes) coronary artery disease prevention counseling today. She is 43 y.o. female and has risk factors for heart disease including obesity. We discussed intensive lifestyle modifications today with an emphasis on specific weight loss instructions and strategies. Pt was also informed of the importance of increasing exercise and decreasing saturated fats to help prevent heart disease.  Obesity Sherry Palmer is currently in the action stage of change. As such, her goal is to continue with weight loss efforts She has agreed to follow the Category 2 plan Sherry Palmer has been prescribed exercise at this time.We discussed the following Behavioral Modification Strategies today: planning for success. Sherry Palmer will get  back on plan as she feels better and appetite improves.  Sherry Palmer has not been prescribed exercise at this time. Sherry Palmer has agreed to follow up with our clinic in 2 weeks. She was informed of the importance of frequent follow up visits to maximize her success with intensive lifestyle modifications for her multiple health conditions.  ALLERGIES: Allergies    Allergen Reactions  . Amoxicillin Hives and Other (See Comments)    Has patient had a PCN reaction causing immediate rash, facial/tongue/throat swelling, SOB or lightheadedness with hypotension: No Has patient had a PCN reaction causing severe rash involving mucus membranes or skin necrosis: No Has patient had a PCN reaction that required hospitalization No Has patient had a PCN reaction occurring within the last 10 years: No If all of the above answers are "NO", then may proceed with Cephalosporin use.    MEDICATIONS: Current Outpatient Medications on File Prior to Visit  Medication Sig Dispense Refill  . ferrous sulfate 325 (65 FE) MG tablet Take 325 mg by mouth daily with breakfast.    . folic acid (FOLVITE) 1 MG tablet Take 1 mg by mouth daily.    . hydrochlorothiazide (HYDRODIURIL) 25 MG tablet Take 25 mg by mouth daily. Take 1/2 tab daily    . labetalol (NORMODYNE) 100 MG tablet Take 100 mg by mouth once.     Marland Kitchen levothyroxine (LEVOTHROID) 137 MCG tablet Take 137 mcg by mouth daily before breakfast.    . losartan (COZAAR) 100 MG tablet Take 100 mg by mouth daily.    . metFORMIN (GLUCOPHAGE) 500 MG tablet Take 1,000 mg by mouth 2 (two) times daily with a meal.      No current facility-administered medications on file prior to visit.     PAST MEDICAL HISTORY: Past Medical History:  Diagnosis Date  . Anemia   . Back pain   . Diabetes mellitus without complication ([place])    type 2  . Dyspnea   . Gallbladder problem   . Hypertension   . Hypothyroidism   . Joint pain   . Obesity   . Postpartum care following vaginal delivery (12/31) 09/01/2016  . Sleep apnea   . Vaginal Pap smear, abnormal     PAST SURGICAL HISTORY: Past Surgical History:  Procedure Laterality Date  . BUNIONECTOMY  2009  . CHOLECYSTECTOMY    . COLPOSCOPY      SOCIAL HISTORY: Social History   Tobacco Use  . Smoking status: Never Smoker  . Smokeless tobacco: Never Used  Substance Use Topics  .  Alcohol use: No  . Drug use: No    FAMILY HISTORY: Family History  Problem Relation Age of Onset  . Hypertension Mother   . Hypothyroidism Mother   . Diabetes Mother   . Hyperlipidemia Mother   . Gout Mother   . Sleep apnea Mother   . Cancer Father        colon  . Hypertension Father   . Cancer Maternal Aunt   . Diabetes Paternal Aunt   . Cancer Maternal Grandmother     ROS: Review of Systems  Constitutional: Negative for weight loss.  Respiratory: Negative for shortness of breath.   Cardiovascular: Negative for chest pain.  Gastrointestinal: Negative for nausea and vomiting.  Musculoskeletal:       Negative for muscle weakness    PHYSICAL EXAM: Blood pressure 140/82, pulse 66, temperature 97.9 F (36.6 C), temperature source Oral, height 5\' 6"  (1.676 m), weight 208 lb (94.3 kg), SpO2 100 %,  unknown if currently breastfeeding. Body mass index is 33.57 kg/m. Physical Exam Vitals signs reviewed.  Constitutional:      Appearance: Normal appearance. She is obese.  Cardiovascular:     Rate and Rhythm: Normal rate.     Pulses: Normal pulses.  Pulmonary:     Effort: Pulmonary effort is normal.  Musculoskeletal: Normal range of motion.  Skin:    General: Skin is warm and dry.  Neurological:     Mental Status: She is alert and oriented to person, place, and time.  Psychiatric:        Mood and Affect: Mood normal.        Behavior: Behavior normal.     RECENT LABS AND TESTS: BMET    Component Value Date/Time   NA 139 09/09/2018 1200   K 4.0 09/09/2018 1200   CL 101 09/09/2018 1200   CO2 21 09/09/2018 1200   GLUCOSE 87 09/09/2018 1200   GLUCOSE 118 (H) 09/01/2016 0646   BUN 9 09/09/2018 1200   CREATININE 0.83 09/09/2018 1200   CALCIUM 9.4 09/09/2018 1200   GFRNONAA 86 09/09/2018 1200   GFRAA 99 09/09/2018 1200   Lab Results  Component Value Date   HGBA1C 6.3 (H) 09/09/2018   HGBA1C 6.7 (H) 05/05/2018   Lab Results  Component Value Date   INSULIN 26.5  (H) 05/05/2018   CBC    Component Value Date/Time   WBC 8.0 05/05/2018 0914   WBC 14.3 (H) 09/01/2016 0646   RBC 5.28 05/05/2018 0914   RBC 3.96 09/01/2016 0646   HGB 11.7 05/05/2018 0914   HCT 36.4 05/05/2018 0914   PLT 231 09/01/2016 0646   MCV 69 (L) 05/05/2018 0914   MCH 22.2 (L) 05/05/2018 0914   MCH 24.0 (L) 09/01/2016 0646   MCHC 32.1 05/05/2018 0914   MCHC 34.3 09/01/2016 0646   RDW 15.9 (H) 05/05/2018 0914   LYMPHSABS 2.1 05/05/2018 0914   EOSABS 0.4 05/05/2018 0914   BASOSABS 0.1 05/05/2018 0914   Iron/TIBC/Ferritin/ %Sat No results found for: IRON, TIBC, FERRITIN, IRONPCTSAT Lipid Panel     Component Value Date/Time   CHOL 149 05/05/2018 0914   TRIG 107 05/05/2018 0914   HDL 52 05/05/2018 0914   LDLCALC 76 05/05/2018 0914   Hepatic Function Panel     Component Value Date/Time   PROT 6.7 09/09/2018 1200   ALBUMIN 4.0 09/09/2018 1200   AST 12 09/09/2018 1200   ALT 11 09/09/2018 1200   ALKPHOS 84 09/09/2018 1200   BILITOT 0.4 09/09/2018 1200      Component Value Date/Time   TSH 0.988 05/05/2018 0914     Ref. Range 09/09/2018 12:00  Vitamin D, 25-Hydroxy Latest Ref Range: 30.0 - 100.0 ng/mL 52.1     OBESITY BEHAVIORAL INTERVENTION VISIT  Today's visit was # 10   Starting weight: 217 lbs Starting date: 05/05/2018 Today's weight : 208 lbs Today's date: 09/22/2018 Total lbs lost to date: 9   ASK: We discussed the diagnosis of obesity with Yolande Jolly today and Tanessa agreed to give [place] permission to discuss obesity behavioral modification therapy today.  ASSESS: Dawne has the diagnosis of obesity and her BMI today is 33.59 Josetta is in the action stage of change   ADVISE: Matalie was educated on the multiple health risks of obesity as well as the benefit of weight loss to improve her health. She was advised of the need for long term treatment and the importance of lifestyle modifications to improve  her current health and to decrease her risk of  future health problems.  AGREE: Multiple dietary modification options and treatment options were discussed and  Cherene agreed to follow the recommendations documented in the above note.  ARRANGE: Lynsey was educated on the importance of frequent visits to treat obesity as outlined per CMS and USPSTF guidelines and agreed to schedule her next follow up appointment today.  I, Tammy Wysor, am acting as Location manager for [employer].  I have reviewed the above documentation for accuracy and completeness, and I agree with the above.  - Teddi Badalamenti, FNP-C.

## 2018-09-24 ENCOUNTER — Other Ambulatory Visit (INDEPENDENT_AMBULATORY_CARE_PROVIDER_SITE_OTHER): Payer: Self-pay | Admitting: Family Medicine

## 2018-09-24 DIAGNOSIS — E559 Vitamin D deficiency, unspecified: Secondary | ICD-10-CM

## 2018-09-27 ENCOUNTER — Encounter (INDEPENDENT_AMBULATORY_CARE_PROVIDER_SITE_OTHER): Payer: Self-pay | Admitting: Family Medicine

## 2018-09-27 DIAGNOSIS — E559 Vitamin D deficiency, unspecified: Secondary | ICD-10-CM | POA: Insufficient documentation

## 2018-09-27 DIAGNOSIS — I10 Essential (primary) hypertension: Secondary | ICD-10-CM | POA: Insufficient documentation

## 2018-10-13 ENCOUNTER — Ambulatory Visit (INDEPENDENT_AMBULATORY_CARE_PROVIDER_SITE_OTHER): Payer: 59 | Admitting: Family Medicine

## 2018-10-13 ENCOUNTER — Encounter (INDEPENDENT_AMBULATORY_CARE_PROVIDER_SITE_OTHER): Payer: Self-pay

## 2018-10-14 ENCOUNTER — Encounter (INDEPENDENT_AMBULATORY_CARE_PROVIDER_SITE_OTHER): Payer: Self-pay | Admitting: Family Medicine

## 2018-10-14 ENCOUNTER — Ambulatory Visit (INDEPENDENT_AMBULATORY_CARE_PROVIDER_SITE_OTHER): Payer: 59 | Admitting: Family Medicine

## 2018-10-14 VITALS — BP 147/83 | HR 77 | Temp 97.7°F | Ht 66.0 in | Wt 209.0 lb

## 2018-10-14 DIAGNOSIS — I10 Essential (primary) hypertension: Secondary | ICD-10-CM | POA: Diagnosis not present

## 2018-10-14 DIAGNOSIS — E119 Type 2 diabetes mellitus without complications: Secondary | ICD-10-CM | POA: Diagnosis not present

## 2018-10-14 DIAGNOSIS — Z1231 Encounter for screening mammogram for malignant neoplasm of breast: Secondary | ICD-10-CM | POA: Diagnosis not present

## 2018-10-14 DIAGNOSIS — Z6833 Body mass index (BMI) 33.0-33.9, adult: Secondary | ICD-10-CM | POA: Diagnosis not present

## 2018-10-14 DIAGNOSIS — E669 Obesity, unspecified: Secondary | ICD-10-CM | POA: Diagnosis not present

## 2018-10-14 NOTE — Progress Notes (Signed)
Office: [phone]  /  Fax: [phone]   HPI:   Chief Complaint: OBESITY Sherry Palmer is here to discuss her progress with her obesity treatment plan. She is on the Category 2 plan and is following her eating plan approximately 30% of the time. She states she is exercising 0 minutes 0 times per week. Sherry Palmer has been stress eating. She is contemplating a job change. Sherry Palmer has not been packing her lunch and has been doing "quick and easy" food. She also has not been planning her meals for dinner. Her weight is 209 lb (94.8 kg) today and has had a weight gain of 1 pound since her last visit. She has lost 8 lbs since starting treatment with [place].  Diabetes II Sherry Palmer has a diagnosis of diabetes type II, which is well controlled on metformin. Imogean states fasting blood sugars are in the 130's and 2 hour postprandial sugars range between 110 and 115. She denies any hypoglycemic episodes. Last A1c was 6.3 on 09/09/2018. She has been working on intensive lifestyle modifications including diet, exercise, and weight loss to help control her blood glucose levels.  Hypertension [name] is a 43 y.o. female with hypertension. BP is elevated today-147/83.  [name] denies chest pain or shortness of breath on exertion. [name] is compliant with her medications. She is working weight loss to help control her blood pressure with the goal of decreasing her risk of heart attack and stroke.   ASSESSMENT AND PLAN:  Type 2 diabetes mellitus without complication, without long-term current use of insulin (HCC)  Essential hypertension  Class 1 obesity with serious comorbidity and body mass index (BMI) of 33.0 to 33.9 in adult, unspecified obesity type  PLAN:  Diabetes II [name] has been given extensive diabetes education by myself today including ideal fasting and post-prandial blood glucose readings, individual ideal HgA1c goals  and hypoglycemia prevention. We discussed the importance of good blood sugar  control to decrease the likelihood of diabetic complications such as nephropathy, neuropathy, limb loss, blindness, coronary artery disease, and death. We discussed the importance of intensive lifestyle modification including diet, exercise and weight loss as the first line treatment for diabetes. Sherry Palmer agrees to continue her metformin and agrees to follow-up with our clinic in 2 weeks.   Hypertension We discussed sodium restriction, working on healthy weight loss, and a regular exercise program as the means to achieve improved blood pressure control. Sherry Palmer will check her blood pressure at home 2-3 times per week and continue her medications. Sherry Palmer agreed with this plan and agreed to follow-up as directed. We will continue to monitor her blood pressure as well as her progress with the above lifestyle modifications. She will continue her medications as prescribed and will watch for signs of hypotension as she continues her lifestyle modifications.  I spent > than 50% of the 15 minute visit on counseling as documented in the note.  Obesity Sherry Palmer is currently in the action stage of change. As such, her goal is to continue with weight loss efforts. She has agreed to follow the Category 2 plan.  Sherry Palmer has not been prescribed exercise at this time. We discussed the following Behavioral Modification Strategies today: decreasing simple carbohydrates, decrease eating out, work on meal planning and easy cooking plans, emotional eating strategies, and planning for success. We discussed fruit options and she was given the Smart Fruit handout.   Sherry Palmer has agreed to follow up with our clinic in 2 weeks. She was informed of the  importance of frequent follow up visits to maximize her success with intensive lifestyle modifications for her multiple health conditions.  ALLERGIES: Allergies  Allergen Reactions  . Amoxicillin Hives and Other (See Comments)    Has patient had a PCN reaction causing immediate rash,  facial/tongue/throat swelling, SOB or lightheadedness with hypotension: No Has patient had a PCN reaction causing severe rash involving mucus membranes or skin necrosis: No Has patient had a PCN reaction that required hospitalization No Has patient had a PCN reaction occurring within the last 10 years: No If all of the above answers are "NO", then may proceed with Cephalosporin use.    MEDICATIONS: Current Outpatient Medications on File Prior to Visit  Medication Sig Dispense Refill  . ferrous sulfate 325 (65 FE) MG tablet Take 325 mg by mouth daily with breakfast.    . folic acid (FOLVITE) 1 MG tablet Take 1 mg by mouth daily.    . hydrochlorothiazide (HYDRODIURIL) 25 MG tablet Take 25 mg by mouth daily. Take 1/2 tab daily    . labetalol (NORMODYNE) 100 MG tablet Take 100 mg by mouth once.     Marland Kitchen levothyroxine (LEVOTHROID) 137 MCG tablet Take 137 mcg by mouth daily before breakfast.    . losartan (COZAAR) 100 MG tablet Take 100 mg by mouth daily.    . metFORMIN (GLUCOPHAGE) 500 MG tablet Take 1,000 mg by mouth 2 (two) times daily with a meal.      No current facility-administered medications on file prior to visit.     PAST MEDICAL HISTORY: Past Medical History:  Diagnosis Date  . Anemia   . Back pain   . Diabetes mellitus without complication ([place])    type 2  . Dyspnea   . Gallbladder problem   . Hypertension   . Hypothyroidism   . Joint pain   . Obesity   . Postpartum care following vaginal delivery (12/31) 09/01/2016  . Sleep apnea   . Vaginal Pap smear, abnormal     PAST SURGICAL HISTORY: Past Surgical History:  Procedure Laterality Date  . BUNIONECTOMY  2009  . CHOLECYSTECTOMY    . COLPOSCOPY      SOCIAL HISTORY: Social History   Tobacco Use  . Smoking status: Never Smoker  . Smokeless tobacco: Never Used  Substance Use Topics  . Alcohol use: No  . Drug use: No    FAMILY HISTORY: Family History  Problem Relation Age of Onset  . Hypertension Mother   .  Hypothyroidism Mother   . Diabetes Mother   . Hyperlipidemia Mother   . Gout Mother   . Sleep apnea Mother   . Cancer Father        colon  . Hypertension Father   . Cancer Maternal Aunt   . Diabetes Paternal Aunt   . Cancer Maternal Grandmother     ROS: Review of Systems  Constitutional: Negative for weight loss.  Respiratory: Negative for shortness of breath.   Cardiovascular: Negative for chest pain.  Endo/Heme/Allergies:       Negative for hypoglycemia.   PHYSICAL EXAM: Blood pressure (!) 147/83, pulse 77, temperature 97.7 F (36.5 C), temperature source Oral, height 5\' 6"  (1.676 m), weight 209 lb (94.8 kg), SpO2 92 %, unknown if currently breastfeeding. Body mass index is 33.73 kg/m. Physical Exam Vitals signs reviewed.  Constitutional:      Appearance: Normal appearance. She is obese.  Cardiovascular:     Rate and Rhythm: Normal rate.     Pulses: Normal pulses.  Pulmonary:     Effort: Pulmonary effort is normal.     Breath sounds: Normal breath sounds.  Musculoskeletal: Normal range of motion.  Skin:    General: Skin is warm and dry.  Neurological:     Mental Status: She is alert and oriented to person, place, and time.  Psychiatric:        Behavior: Behavior normal.   RECENT LABS AND TESTS: BMET    Component Value Date/Time   NA 139 09/09/2018 1200   K 4.0 09/09/2018 1200   CL 101 09/09/2018 1200   CO2 21 09/09/2018 1200   GLUCOSE 87 09/09/2018 1200   GLUCOSE 118 (H) 09/01/2016 0646   BUN 9 09/09/2018 1200   CREATININE 0.83 09/09/2018 1200   CALCIUM 9.4 09/09/2018 1200   GFRNONAA 86 09/09/2018 1200   GFRAA 99 09/09/2018 1200   Lab Results  Component Value Date   HGBA1C 6.3 (H) 09/09/2018   HGBA1C 6.7 (H) 05/05/2018   Lab Results  Component Value Date   INSULIN 26.5 (H) 05/05/2018   CBC    Component Value Date/Time   WBC 8.0 05/05/2018 0914   WBC 14.3 (H) 09/01/2016 0646   RBC 5.28 05/05/2018 0914   RBC 3.96 09/01/2016 0646   HGB 11.7  05/05/2018 0914   HCT 36.4 05/05/2018 0914   PLT 231 09/01/2016 0646   MCV 69 (L) 05/05/2018 0914   MCH 22.2 (L) 05/05/2018 0914   MCH 24.0 (L) 09/01/2016 0646   MCHC 32.1 05/05/2018 0914   MCHC 34.3 09/01/2016 0646   RDW 15.9 (H) 05/05/2018 0914   LYMPHSABS 2.1 05/05/2018 0914   EOSABS 0.4 05/05/2018 0914   BASOSABS 0.1 05/05/2018 0914   Iron/TIBC/Ferritin/ %Sat No results found for: IRON, TIBC, FERRITIN, IRONPCTSAT Lipid Panel     Component Value Date/Time   CHOL 149 05/05/2018 0914   TRIG 107 05/05/2018 0914   HDL 52 05/05/2018 0914   LDLCALC 76 05/05/2018 0914   Hepatic Function Panel     Component Value Date/Time   PROT 6.7 09/09/2018 1200   ALBUMIN 4.0 09/09/2018 1200   AST 12 09/09/2018 1200   ALT 11 09/09/2018 1200   ALKPHOS 84 09/09/2018 1200   BILITOT 0.4 09/09/2018 1200      Component Value Date/Time   TSH 0.988 05/05/2018 0914   Results for Sherry Palmer, Sherry Palmer (MRN [id number]) as of 10/14/2018 14:21  Ref. Range 09/09/2018 12:00  Vitamin D, 25-Hydroxy Latest Ref Range: 30.0 - 100.0 ng/mL 52.1   OBESITY BEHAVIORAL INTERVENTION VISIT  Today's visit was #11  Starting weight: 217 lbs Starting date: 05/05/2018 Today's weight: 209 lbs Today's date: 10/14/2018 Total lbs lost to date: 8  ASK: We discussed the diagnosis of obesity with Yolande Jolly today and Saaya agreed to give [place] permission to discuss obesity behavioral modification therapy today.  ASSESS: Corrinna has the diagnosis of obesity and her BMI today is 33.73. Aviendha is in the action stage of change.  ADVISE: Lennie was educated on the multiple health risks of obesity as well as the benefit of weight loss to improve her health. She was advised of the need for long term treatment and the importance of lifestyle modifications to improve her current health and to decrease her risk of future health problems.  AGREE: Multiple dietary modification options and treatment options were discussed and  Dezhane  agreed to follow the recommendations documented in the above note.  ARRANGE: Jerolyn was educated on the importance of frequent visits to  treat obesity as outlined per CMS and USPSTF guidelines and agreed to schedule her next follow up appointment today.  IMichaelene Song, am acting as Location manager for [employer], FNP-C.  I have reviewed the above documentation for accuracy and completeness, and I agree with the above.  - Yasir Kitner, FNP-C.

## 2018-10-19 ENCOUNTER — Encounter (INDEPENDENT_AMBULATORY_CARE_PROVIDER_SITE_OTHER): Payer: Self-pay | Admitting: Family Medicine

## 2018-11-01 ENCOUNTER — Ambulatory Visit (INDEPENDENT_AMBULATORY_CARE_PROVIDER_SITE_OTHER): Payer: 59 | Admitting: Family Medicine

## 2018-11-02 ENCOUNTER — Other Ambulatory Visit (INDEPENDENT_AMBULATORY_CARE_PROVIDER_SITE_OTHER): Payer: Self-pay | Admitting: Family Medicine

## 2018-11-03 ENCOUNTER — Ambulatory Visit (INDEPENDENT_AMBULATORY_CARE_PROVIDER_SITE_OTHER): Payer: 59 | Admitting: Family Medicine

## 2018-11-03 ENCOUNTER — Encounter (INDEPENDENT_AMBULATORY_CARE_PROVIDER_SITE_OTHER): Payer: Self-pay | Admitting: Family Medicine

## 2018-11-03 VITALS — BP 110/75 | HR 89 | Temp 98.2°F | Ht 66.0 in | Wt 209.0 lb

## 2018-11-03 DIAGNOSIS — F3289 Other specified depressive episodes: Secondary | ICD-10-CM | POA: Diagnosis not present

## 2018-11-03 DIAGNOSIS — F329 Major depressive disorder, single episode, unspecified: Secondary | ICD-10-CM | POA: Insufficient documentation

## 2018-11-03 DIAGNOSIS — Z9189 Other specified personal risk factors, not elsewhere classified: Secondary | ICD-10-CM

## 2018-11-03 DIAGNOSIS — I10 Essential (primary) hypertension: Secondary | ICD-10-CM

## 2018-11-03 DIAGNOSIS — Z6833 Body mass index (BMI) 33.0-33.9, adult: Secondary | ICD-10-CM

## 2018-11-03 DIAGNOSIS — E669 Obesity, unspecified: Secondary | ICD-10-CM | POA: Diagnosis not present

## 2018-11-03 DIAGNOSIS — F32A Depression, unspecified: Secondary | ICD-10-CM | POA: Insufficient documentation

## 2018-11-03 MED ORDER — LOSARTAN POTASSIUM 100 MG PO TABS: 100.0000 mg | ORAL_TABLET | Freq: Every day | ORAL | 0 refills | Status: DC

## 2018-11-03 NOTE — Progress Notes (Signed)
Office: [phone]  /  Fax: [phone]   HPI:   Chief Complaint: OBESITY Sherry Palmer is here to discuss her progress with her obesity treatment plan. She is on the Category 2 plan and is following her eating plan approximately 50% of the time. She states she is exercising 0 minutes 0 times per week. Sherry Palmer states she is eating all of the protein on the plan. However, she has been snacking more and is struggling to get back on track. Her weight is 209 lb (94.8 kg) today and has not lost weight since her last visit. She has lost 8 lbs since starting treatment with [place].  Hypertension Sherry Palmer is a 43 y.o. female with hypertension which is well controlled on HCTZ, labetalol, and losartan. Sherry Palmer denies chest pain or shortness of breath on exertion. She is working weight loss to help control her blood pressure with the goal of decreasing her risk of heart attack and stroke. Sherry Palmer's blood pressure is currently controlled.  At risk for cardiovascular disease Sherry Palmer is at a higher than average risk for cardiovascular disease due to obesity. She currently denies any chest pain.  Depression with emotional eating behaviors Sherry Palmer states she habitually snacks while watching TV and also at other times. She denies increased stress. She states that she eats more than 200 calories allotted for snacks and is struggling with emotional eating and using food for comfort to the extent that it is negatively impacting her health. She often snacks when she is not hungry. Sherry Palmer sometimes feels she is out of control and then feels guilty that she made poor food choices. She has been working on behavior modification techniques to help reduce her emotional eating and has been somewhat successful. She shows no sign of suicidal or homicidal ideations.  Depression screen [facility] 2/9 05/05/2018 03/12/2016  Decreased Interest 1 0  Down, Depressed, Hopeless 1 0  PHQ - 2 Score 2 0  Altered sleeping 0 -  Tired, decreased  energy 2 -  Change in appetite 1 -  Feeling bad or failure about yourself  0 -  Trouble concentrating 1 -  Moving slowly or fidgety/restless 0 -  Suicidal thoughts 0 -  PHQ-9 Score 6 -  Difficult doing work/chores Not difficult at all -   ASSESSMENT AND PLAN:  Essential hypertension - Plan: losartan (COZAAR) 100 MG tablet  Other depression  At risk for heart disease  Class 1 obesity with serious comorbidity and body mass index (BMI) of 33.0 to 33.9 in adult, unspecified obesity type  PLAN:  Hypertension We discussed sodium restriction, working on healthy weight loss, and a regular exercise program as the means to achieve improved blood pressure control. Sherry Palmer agreed with this plan and agreed to follow up as directed. We will continue to monitor her blood pressure as well as her progress with the above lifestyle modifications. She was given a refill on her losartan 100 mg qd #30 with 0 refills and will watch for signs of hypotension as she continues her lifestyle modifications.  Cardiovascular risk counseling Sherry Palmer was given extended (15 minutes) coronary artery disease prevention counseling today. She is 43 y.o. female and has risk factors for heart disease including obesity. We discussed intensive lifestyle modifications today with an emphasis on specific weight loss instructions and strategies. Pt was also informed of the importance of increasing exercise and decreasing saturated fats to help prevent heart disease.  Depression with Emotional Eating Behaviors We discussed behavior modification techniques today to  help Sherry Palmer deal with her emotional eating and depression. She will work on strategies to decrease her emotional eating. We did discuss bupropion and counseling but she wishes to defer at this time.   Obesity Sherry Palmer is currently in the action stage of change. As such, her goal is to continue with weight loss efforts. She has agreed to follow the Category 2 plan. Sherry Palmer has  been instructed to go to the gym 2-3 times per week for 30 minutes. We discussed the following Behavioral Modification Strategies today: decreasing simple carbohydrates, emotional eating strategies, and planning for success.  Sherry Palmer has agreed to follow-up with our clinic in 3 weeks. She was informed of the importance of frequent follow up visits to maximize her success with intensive lifestyle modifications for her multiple health conditions.  ALLERGIES: Allergies  Allergen Reactions  . Amoxicillin Hives and Other (See Comments)    Has patient had a PCN reaction causing immediate rash, facial/tongue/throat swelling, SOB or lightheadedness with hypotension: No Has patient had a PCN reaction causing severe rash involving mucus membranes or skin necrosis: No Has patient had a PCN reaction that required hospitalization No Has patient had a PCN reaction occurring within the last 10 years: No If all of the above answers are "NO", then may proceed with Cephalosporin use.    MEDICATIONS: Current Outpatient Medications on File Prior to Visit  Medication Sig Dispense Refill  . ferrous sulfate 325 (65 FE) MG tablet Take 325 mg by mouth daily with breakfast.    . folic acid (FOLVITE) 1 MG tablet Take 1 mg by mouth daily.    . hydrochlorothiazide (HYDRODIURIL) 25 MG tablet Take 25 mg by mouth daily. Take 1/2 tab daily    . labetalol (NORMODYNE) 100 MG tablet Take 100 mg by mouth once.     Marland Kitchen levothyroxine (LEVOTHROID) 137 MCG tablet Take 137 mcg by mouth daily before breakfast.    . metFORMIN (GLUCOPHAGE) 500 MG tablet Take 1,000 mg by mouth 2 (two) times daily with a meal.      No current facility-administered medications on file prior to visit.     PAST MEDICAL HISTORY: Past Medical History:  Diagnosis Date  . Anemia   . Back pain   . Diabetes mellitus without complication ([place])    type 2  . Dyspnea   . Gallbladder problem   . Hypertension   . Hypothyroidism   . Joint pain   . Obesity     . Postpartum care following vaginal delivery (12/31) 09/01/2016  . Sleep apnea   . Vaginal Pap smear, abnormal     PAST SURGICAL HISTORY: Past Surgical History:  Procedure Laterality Date  . BUNIONECTOMY  2009  . CHOLECYSTECTOMY    . COLPOSCOPY      SOCIAL HISTORY: Social History   Tobacco Use  . Smoking status: Never Smoker  . Smokeless tobacco: Never Used  Substance Use Topics  . Alcohol use: No  . Drug use: No    FAMILY HISTORY: Family History  Problem Relation Age of Onset  . Hypertension Mother   . Hypothyroidism Mother   . Diabetes Mother   . Hyperlipidemia Mother   . Gout Mother   . Sleep apnea Mother   . Cancer Father        colon  . Hypertension Father   . Cancer Maternal Aunt   . Diabetes Paternal Aunt   . Cancer Maternal Grandmother    ROS: Review of Systems  Constitutional: Negative for weight loss.  Respiratory: Negative for shortness of breath.   Cardiovascular: Negative for chest pain.  Psychiatric/Behavioral: Positive for depression (emotional eating).   PHYSICAL EXAM: Blood pressure 110/75, pulse 89, temperature 98.2 F (36.8 C), temperature source Oral, height 5\' 6"  (1.676 m), weight 209 lb (94.8 kg), last menstrual period 10/15/2018, SpO2 97 %, not currently breastfeeding. Body mass index is 33.73 kg/m. Physical Exam Vitals signs reviewed.  Constitutional:      Appearance: Normal appearance. She is obese.  Cardiovascular:     Rate and Rhythm: Normal rate.     Pulses: Normal pulses.  Pulmonary:     Effort: Pulmonary effort is normal.     Breath sounds: Normal breath sounds.  Musculoskeletal: Normal range of motion.  Skin:    General: Skin is warm and dry.  Neurological:     Mental Status: She is alert and oriented to person, place, and time.  Psychiatric:        Behavior: Behavior normal.   RECENT LABS AND TESTS: BMET    Component Value Date/Time   NA 139 09/09/2018 1200   K 4.0 09/09/2018 1200   CL 101 09/09/2018 1200    CO2 21 09/09/2018 1200   GLUCOSE 87 09/09/2018 1200   GLUCOSE 118 (H) 09/01/2016 0646   BUN 9 09/09/2018 1200   CREATININE 0.83 09/09/2018 1200   CALCIUM 9.4 09/09/2018 1200   GFRNONAA 86 09/09/2018 1200   GFRAA 99 09/09/2018 1200   Lab Results  Component Value Date   HGBA1C 6.3 (H) 09/09/2018   HGBA1C 6.7 (H) 05/05/2018   Lab Results  Component Value Date   INSULIN 26.5 (H) 05/05/2018   CBC    Component Value Date/Time   WBC 8.0 05/05/2018 0914   WBC 14.3 (H) 09/01/2016 0646   RBC 5.28 05/05/2018 0914   RBC 3.96 09/01/2016 0646   HGB 11.7 05/05/2018 0914   HCT 36.4 05/05/2018 0914   PLT 231 09/01/2016 0646   MCV 69 (L) 05/05/2018 0914   MCH 22.2 (L) 05/05/2018 0914   MCH 24.0 (L) 09/01/2016 0646   MCHC 32.1 05/05/2018 0914   MCHC 34.3 09/01/2016 0646   RDW 15.9 (H) 05/05/2018 0914   LYMPHSABS 2.1 05/05/2018 0914   EOSABS 0.4 05/05/2018 0914   BASOSABS 0.1 05/05/2018 0914   Iron/TIBC/Ferritin/ %Sat No results found for: IRON, TIBC, FERRITIN, IRONPCTSAT Lipid Panel     Component Value Date/Time   CHOL 149 05/05/2018 0914   TRIG 107 05/05/2018 0914   HDL 52 05/05/2018 0914   LDLCALC 76 05/05/2018 0914   Hepatic Function Panel     Component Value Date/Time   PROT 6.7 09/09/2018 1200   ALBUMIN 4.0 09/09/2018 1200   AST 12 09/09/2018 1200   ALT 11 09/09/2018 1200   ALKPHOS 84 09/09/2018 1200   BILITOT 0.4 09/09/2018 1200      Component Value Date/Time   TSH 0.988 05/05/2018 0914    Ref. Range 09/09/2018 12:00  Vitamin D, 25-Hydroxy Latest Ref Range: 30.0 - 100.0 ng/mL 52.1   OBESITY BEHAVIORAL INTERVENTION VISIT  Today's visit was #12  Starting weight: 217 lbs Starting date: 05/05/2018 Today's weight: 209 lbs Today's date: 11/03/2018 Total lbs lost to date: 8    11/03/2018  Height 5\' 6"  (1.676 m)  Weight 209 lb (94.8 kg)  BMI (Calculated) 33.75  BLOOD PRESSURE - SYSTOLIC 382  BLOOD PRESSURE - DIASTOLIC 75   Body Fat % 50.5 %  Total Body Water  (lbs) 79.2 lbs   ASK: We  discussed the diagnosis of obesity with Sherry Palmer today and Abryana agreed to give [place] permission to discuss obesity behavioral modification therapy today.  ASSESS: Azaylea has the diagnosis of obesity and her BMI today is 33.75. Zylie is in the action stage of change.  ADVISE: Korryn was educated on the multiple health risks of obesity as well as the benefit of weight loss to improve her health. She was advised of the need for long term treatment and the importance of lifestyle modifications to improve her current health and to decrease her risk of future health problems.  AGREE: Multiple dietary modification options and treatment options were discussed and  Maly agreed to follow the recommendations documented in the above note.  ARRANGE: Birtie was educated on the importance of frequent visits to treat obesity as outlined per CMS and USPSTF guidelines and agreed to schedule her next follow up appointment today.  IMichaelene Song, am acting as Location manager for [employer], FNP-C.  I have reviewed the above documentation for accuracy and completeness, and I agree with the above.  - Dossie Ocanas, FNP-C.

## 2018-11-24 ENCOUNTER — Ambulatory Visit (INDEPENDENT_AMBULATORY_CARE_PROVIDER_SITE_OTHER): Payer: 59 | Admitting: Family Medicine

## 2018-11-25 ENCOUNTER — Encounter (INDEPENDENT_AMBULATORY_CARE_PROVIDER_SITE_OTHER): Payer: Self-pay

## 2018-12-02 DIAGNOSIS — D509 Iron deficiency anemia, unspecified: Secondary | ICD-10-CM | POA: Diagnosis not present

## 2018-12-02 DIAGNOSIS — E039 Hypothyroidism, unspecified: Secondary | ICD-10-CM | POA: Diagnosis not present

## 2018-12-02 DIAGNOSIS — I10 Essential (primary) hypertension: Secondary | ICD-10-CM | POA: Diagnosis not present

## 2018-12-02 DIAGNOSIS — E119 Type 2 diabetes mellitus without complications: Secondary | ICD-10-CM | POA: Diagnosis not present

## 2019-06-06 ENCOUNTER — Other Ambulatory Visit: Payer: Self-pay

## 2019-06-06 DIAGNOSIS — Z20822 Contact with and (suspected) exposure to covid-19: Secondary | ICD-10-CM

## 2019-06-08 LAB — NOVEL CORONAVIRUS, NAA: SARS-CoV-2, NAA: NOT DETECTED

## 2019-07-22 ENCOUNTER — Other Ambulatory Visit: Payer: Self-pay

## 2019-07-22 DIAGNOSIS — Z20822 Contact with and (suspected) exposure to covid-19: Secondary | ICD-10-CM

## 2019-07-25 LAB — NOVEL CORONAVIRUS, NAA: SARS-CoV-2, NAA: NOT DETECTED

## 2019-11-05 ENCOUNTER — Ambulatory Visit: Payer: 59 | Attending: Internal Medicine

## 2019-11-05 DIAGNOSIS — Z23 Encounter for immunization: Secondary | ICD-10-CM | POA: Insufficient documentation

## 2019-11-05 NOTE — Progress Notes (Signed)
   Covid-19 Vaccination Clinic  Name:  Sherry Palmer    MRN: [id number] DOB: 1975-10-01  11/05/2019  [name] was observed post Covid-19 immunization for 15 minutes without incident. She was provided with Vaccine Information Sheet and instruction to access the V-Safe system.   [name] was instructed to call 911 with any severe reactions post vaccine: Marland Kitchen Difficulty breathing  . Swelling of face and throat  . A fast heartbeat  . A bad rash all over body  . Dizziness and weakness   Immunizations Administered    Name Date Dose VIS Date Route   Pfizer COVID-19 Vaccine 11/05/2019  9:18 AM 0.3 mL 08/12/2019 Intramuscular   Manufacturer: [place]   Lot: [id number]   [place]: [id number]

## 2019-11-26 ENCOUNTER — Ambulatory Visit: Payer: 59 | Attending: Internal Medicine

## 2019-11-26 DIAGNOSIS — Z23 Encounter for immunization: Secondary | ICD-10-CM

## 2019-11-26 NOTE — Progress Notes (Signed)
   Covid-19 Vaccination Clinic  Name:  Sherry Palmer    MRN: [id number] DOB: 1976/05/09  11/26/2019  [name] was observed post Covid-19 immunization for 15 minutes without incident. She was provided with Vaccine Information Sheet and instruction to access the V-Safe system.   Ms. Arch was instructed to call 911 with any severe reactions post vaccine: Marland Kitchen Difficulty breathing  . Swelling of face and throat  . A fast heartbeat  . A bad rash all over body  . Dizziness and weakness   Immunizations Administered    Name Date Dose VIS Date Route   Pfizer COVID-19 Vaccine 11/26/2019  9:14 AM 0.3 mL 08/12/2019 Intramuscular   Manufacturer: [place]   Lot: [id number]   [place]: [id number]

## 2021-07-18 NOTE — Progress Notes (Signed)
Sherry Ade, MD Reason for referral-chest pain  HPI: 45 year old female for evaluation of chest pain at request of Sela Hilding, MD. Laboratories June 2022 showed creatinine 0.86.  Patient states that for the past 2 months she has had occasional chest pain.  It is substernal radiating to the back.  It can increase with certain movements.  Typically lasts 20 minutes and resolve spontaneously.  No associated symptoms.  Her pain is not clearly exertional.  She occasionally has some dyspnea on exertion but no orthopnea, PND, pedal edema or syncope.  Cardiology now asked to evaluate.  Current Outpatient Medications  Medication Sig Dispense Refill   hydrochlorothiazide (HYDRODIURIL) 25 MG tablet Take 25 mg by mouth daily. Take 1/2 tab daily     irbesartan (AVAPRO) 300 MG tablet Take 300 mg by mouth daily.     labetalol (NORMODYNE) 200 MG tablet Take 100 mg by mouth 2 (two) times daily.     levothyroxine (SYNTHROID) 137 MCG tablet Take 137 mcg by mouth daily before breakfast.     losartan (COZAAR) 100 MG tablet Take 1 tablet (100 mg total) by mouth daily. 30 tablet 0   metFORMIN (GLUCOPHAGE) 500 MG tablet Take 1,000 mg by mouth 2 (two) times daily with a meal.      OZEMPIC, 0.25 OR 0.5 MG/DOSE, 2 MG/1.5ML SOPN Inject 0.5 mg into the skin once a week.     pravastatin (PRAVACHOL) 20 MG tablet Take 20 mg by mouth once a week.     ferrous sulfate 325 (65 FE) MG tablet Take 325 mg by mouth daily with breakfast.     folic acid (FOLVITE) 1 MG tablet Take 1 mg by mouth daily. (Patient not taking: Reported on 07/30/2021)     labetalol (NORMODYNE) 100 MG tablet Take 100 mg by mouth once.  (Patient not taking: Reported on 07/30/2021)     No current facility-administered medications for this visit.    Allergies  Allergen Reactions   Amoxicillin Hives and Other (See Comments)    Has patient had a PCN reaction causing immediate rash, facial/tongue/throat swelling, SOB or  lightheadedness with hypotension: No Has patient had a PCN reaction causing severe rash involving mucus membranes or skin necrosis: No Has patient had a PCN reaction that required hospitalization No Has patient had a PCN reaction occurring within the last 10 years: No If all of the above answers are "NO", then may proceed with Cephalosporin use.     Past Medical History:  Diagnosis Date   Anemia    Back pain    Diabetes mellitus without complication ([place])    type 2   Gallbladder problem    Hypertension    Hypothyroidism    Joint pain    Obesity    Postpartum care following vaginal delivery (12/31) 09/01/2016   Sleep apnea    Vaginal Pap smear, abnormal     Past Surgical History:  Procedure Laterality Date   BUNIONECTOMY  2009   CHOLECYSTECTOMY     COLPOSCOPY      Social History   Socioeconomic History   Marital status: Single    Spouse name: Not on file   Number of children: 1   Years of education: Not on file   Highest education level: Not on file  Occupational History   Occupation: Accountant  Tobacco Use   Smoking status: Never   Smokeless tobacco: Never  Substance and Sexual Activity   Alcohol use: Yes    Comment: Occasional   Drug  use: No   Sexual activity: Yes  Other Topics Concern   Not on file  Social History Narrative   Not on file   Social Determinants of Health   Financial Resource Strain: Not on file  Food Insecurity: Not on file  Transportation Needs: Not on file  Physical Activity: Not on file  Stress: Not on file  Social Connections: Not on file  Intimate Partner Violence: Not on file    Family History  Problem Relation Age of Onset   Hypertension Mother    Hypothyroidism Mother    Diabetes Mother    Hyperlipidemia Mother    Gout Mother    Sleep apnea Mother    Cancer Father        colon   Hypertension Father    Cancer Maternal Aunt    Diabetes Paternal Aunt    Cancer Maternal Grandmother     ROS: no fevers or chills,  productive cough, hemoptysis, dysphasia, odynophagia, melena, hematochezia, dysuria, hematuria, rash, seizure activity, orthopnea, PND, pedal edema, claudication. Remaining systems are negative.  Physical Exam:   Blood pressure 112/72, pulse 83, height 5' 6.75" (1.695 m), weight 219 lb (99.3 kg), SpO2 99 %.  General:  Well developed/well nourished in NAD Skin warm/dry Patient not depressed No peripheral clubbing Back-normal HEENT-normal/normal eyelids Neck supple/normal carotid upstroke bilaterally; no bruits; no JVD; no thyromegaly chest - CTA/ normal expansion CV - RRR/normal S1 and S2; no murmurs, rubs or gallops;  PMI nondisplaced Abdomen -NT/ND, no HSM, no mass, + bowel sounds, no bruit 2+ femoral pulses, no bruits Ext-no edema, chords, 2+ DP Neuro-grossly nonfocal  ECG -Nov 2022-sinus rhythm with nonspecific T wave changes V1 through V3.  Personally reviewed  Today's electrocardiogram shows sinus rhythm, RV conduction delay, no significant ST changes, personally reviewed.  1 chest pain-symptoms are atypical but she has multiple risk factors.  We will arrange cardiac CTA to rule out obstructive coronary disease.  2 hypertension-patient's blood pressure is controlled.  Continue present medications.  3 obstructive sleep apnea-follow-up primary care.  4 diabetes mellitus -patient takes pravastatin 20 mg weekly.  Would likely increase dose as diabetes is a coronary artery disease equivalent.  We will leave to primary care.  Kirk Ruths, MD

## 2021-07-30 ENCOUNTER — Ambulatory Visit: Payer: 59 | Admitting: Cardiology

## 2021-07-30 ENCOUNTER — Other Ambulatory Visit: Payer: Self-pay

## 2021-07-30 ENCOUNTER — Encounter: Payer: Self-pay | Admitting: Cardiology

## 2021-07-30 VITALS — BP 112/72 | HR 83 | Ht 66.75 in | Wt 219.0 lb

## 2021-07-30 DIAGNOSIS — I10 Essential (primary) hypertension: Secondary | ICD-10-CM | POA: Diagnosis not present

## 2021-07-30 DIAGNOSIS — R072 Precordial pain: Secondary | ICD-10-CM

## 2021-07-30 MED ORDER — METOPROLOL TARTRATE 100 MG PO TABS: ORAL_TABLET | ORAL | 0 refills | Status: AC

## 2021-07-30 NOTE — Patient Instructions (Signed)
  Testing/Procedures:  Your cardiac CT will be scheduled at   [facility] [place], [place] [address] [phone]   If scheduled at [facility], please arrive at the [facility] main entrance (entrance A) of [facility] 30 minutes prior to test start time. You can use the FREE valet parking offered at the main entrance (encouraged to control the heart rate for the test) Proceed to the [facility] Radiology Department (first floor) to check-in and test prep.   Please follow these instructions carefully (unless otherwise directed):  On the Night Before the Test: Be sure to Drink plenty of water. Do not consume any caffeinated/decaffeinated beverages or chocolate 12 hours prior to your test. Do not take any antihistamines 12 hours prior to your test.   On the Day of the Test: Drink plenty of water until 1 hour prior to the test. Do not eat any food 4 hours prior to the test. You may take your regular medications prior to the test.  Take metoprolol (Lopressor) 100 mg two hours prior to test. HOLD Furosemide/Hydrochlorothiazide morning of the test. FEMALES- please wear underwire-free bra if available, avoid dresses & tight clothing        After the Test: Drink plenty of water. After receiving IV contrast, you may experience a mild flushed feeling. This is normal. On occasion, you may experience a mild rash up to 24 hours after the test. This is not dangerous. If this occurs, you can take Benadryl 25 mg and increase your fluid intake. If you experience trouble breathing, this can be serious. If it is severe call 911 IMMEDIATELY. If it is mild, please call our office. If you take any of these medications: Glipizide/Metformin, Avandament, Glucavance, please do not take 48 hours after completing test unless otherwise instructed.  Please allow 2-4 weeks for scheduling of routine cardiac CTs. Some insurance companies require a pre-authorization  which may delay scheduling of this test.   For non-scheduling related questions, please contact the cardiac imaging nurse navigator should you have any questions/concerns: Marchia Bond, Cardiac Imaging Nurse Navigator Gordy Clement, Cardiac Imaging Nurse Navigator [facility] Direct Office Dial: [phone]   For scheduling needs, including cancellations and rescheduling, please call [place], [phone].    Follow-Up: At [facility], you and your health needs are our priority.  As part of our continuing mission to provide you with exceptional heart care, we have created designated Provider Care Teams.  These Care Teams include your primary Cardiologist (physician) and Advanced Practice Providers (APPs -  Physician Assistants and Nurse Practitioners) who all work together to provide you with the care you need, when you need it.  We recommend signing up for the patient portal called "MyChart".  Sign up information is provided on this After Visit Summary.  MyChart is used to connect with patients for Virtual Visits (Telemedicine).  Patients are able to view lab/test results, encounter notes, upcoming appointments, etc.  Non-urgent messages can be sent to your provider as well.   To learn more about what you can do with MyChart, go to [email or web address].    Your next appointment:     As needed

## 2021-08-08 ENCOUNTER — Telehealth (HOSPITAL_COMMUNITY): Payer: Self-pay | Admitting: *Deleted

## 2021-08-08 NOTE — Telephone Encounter (Signed)
Reaching out to patient to offer assistance regarding upcoming cardiac imaging study; pt verbalizes understanding of appt date/time, parking situation and where to check in, pre-test NPO status and medications ordered, and verified current allergies; name and call back number provided for further questions should they arise  Gordy Clement RN Navigator Cardiac Imaging [facility] [phone] office [phone] cell  Patient to take 100mg  metoprolol tartrate two hours prior to her cardiac CT scan. She is aware to obtain blood work prior to scan and arrive at 8:30am for her 9am scan.

## 2021-08-09 LAB — BASIC METABOLIC PANEL
BUN/Creatinine Ratio: 11 (ref 9–23)
BUN: 10 mg/dL (ref 6–24)
CO2: 22 mmol/L (ref 20–29)
Calcium: 9.2 mg/dL (ref 8.7–10.2)
Chloride: 101 mmol/L (ref 96–106)
Creatinine, Ser: 0.88 mg/dL (ref 0.57–1.00)
Glucose: 131 mg/dL — ABNORMAL HIGH (ref 70–99)
Potassium: 4.1 mmol/L (ref 3.5–5.2)
Sodium: 137 mmol/L (ref 134–144)
eGFR: 82 mL/min/{1.73_m2} (ref >59)

## 2021-08-12 ENCOUNTER — Other Ambulatory Visit: Payer: Self-pay

## 2021-08-12 ENCOUNTER — Ambulatory Visit (HOSPITAL_COMMUNITY)
Admission: RE | Admit: 2021-08-12 | Discharge: 2021-08-12 | Disposition: A | Discharge status: Home / Self Care | Payer: 59 | Source: Ambulatory Visit | Attending: Cardiology | Admitting: Cardiology

## 2021-08-12 ENCOUNTER — Encounter (HOSPITAL_COMMUNITY): Payer: Self-pay

## 2021-08-12 DIAGNOSIS — R072 Precordial pain: Secondary | ICD-10-CM | POA: Insufficient documentation

## 2021-08-12 MED ORDER — METOPROLOL TARTRATE 5 MG/5ML IV SOLN: 10.0000 mg | Freq: Once | INTRAVENOUS | Status: AC

## 2021-08-12 MED ORDER — METOPROLOL TARTRATE 5 MG/5ML IV SOLN
10.0000 mg | Freq: Once | INTRAVENOUS | Status: AC
  Administered 2021-08-12: 10 mg via INTRAVENOUS

## 2021-08-12 MED ORDER — NITROGLYCERIN 0.4 MG SL SUBL: 0.8000 mg | SUBLINGUAL_TABLET | Freq: Once | SUBLINGUAL | Status: AC

## 2021-08-12 MED ORDER — METOPROLOL TARTRATE 5 MG/5ML IV SOLN
INTRAVENOUS | Status: AC
  Administered 2021-08-12: 10 mg via INTRAVENOUS
  Filled 2021-08-12: qty 10

## 2021-08-12 MED ORDER — NITROGLYCERIN 0.4 MG SL SUBL
SUBLINGUAL_TABLET | SUBLINGUAL | Status: AC
  Administered 2021-08-12: 0.8 mg via SUBLINGUAL
  Filled 2021-08-12: qty 2

## 2021-08-12 MED ORDER — IOHEXOL 350 MG/ML SOLN
100.0000 mL | Freq: Once | INTRAVENOUS | Status: AC | PRN
  Administered 2021-08-12: 95 mL via INTRAVENOUS

## 2021-08-12 MED ORDER — METOPROLOL TARTRATE 5 MG/5ML IV SOLN
INTRAVENOUS | Status: AC
  Filled 2021-08-12: qty 10

## 2021-12-05 ENCOUNTER — Other Ambulatory Visit: Payer: Self-pay | Admitting: Radiology

## 2023-03-10 IMAGING — CT CT HEART MORP W/ CTA COR W/ SCORE W/ CA W/CM &/OR W/O CM
3 of 9 series · 6 of 20 positions shown, 7 images · IV contrast (omnipaque)
Comparison: None

Addendum:
HISTORY: 45 yo female, chest pain, pericardial disease suspected

EXAM:
Cardiac/Coronary CTA
TECHNIQUE: The patient was scanned on a Siemens Force scanner.
PROTOCOL: A 130 kV prospective scan was triggered in the descending thoracic
aorta at 111 HU's. Axial non-contrast 3 mm slices were carried out
through the heart. The data set was analyzed on a dedicated work
station and scored using the Agatson method. Gantry rotation speed
was 250 msecs and collimation was .6 mm. Beta blockade and 0.8 mg of
sl NTG was given. The 3D data set was reconstructed in 5% intervals
of the 35-75 % of the R-R cycle. Diastolic phases were analyzed on a
dedicated work station using MPR, MIP and VRT modes. The patient
received 95mL OMNIPAQUE IOHEXOL 350 MG/ML SOLN of contrast.

[Series 7: best syst 45 % · axial · 0.41mm/px · z∈[-415,-374]mm · 2 of 308 slices shown, 3 images]
[im 103/308  vessel]
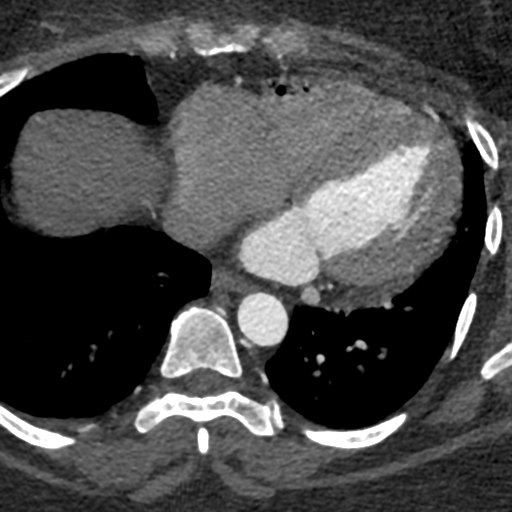
[im 103/308  lung]
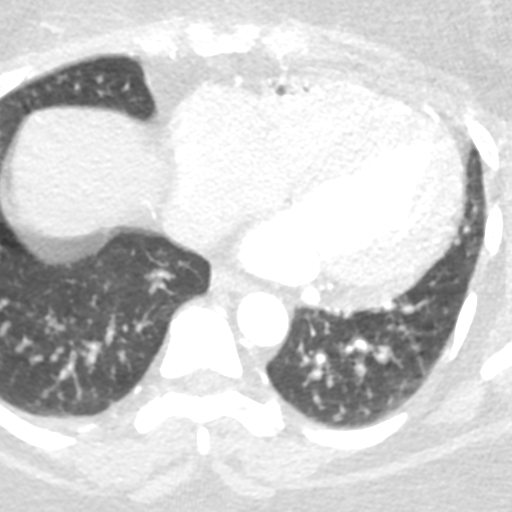
[im 205/308  vessel]
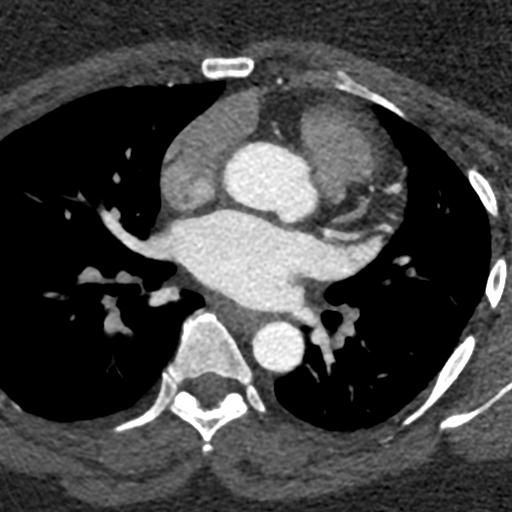

[Series 8: ts diast sharp 74 % · axial · 0.41mm/px · z∈[-415,-374]mm · 2 of 308 slices shown]
[im 103/308  lung]
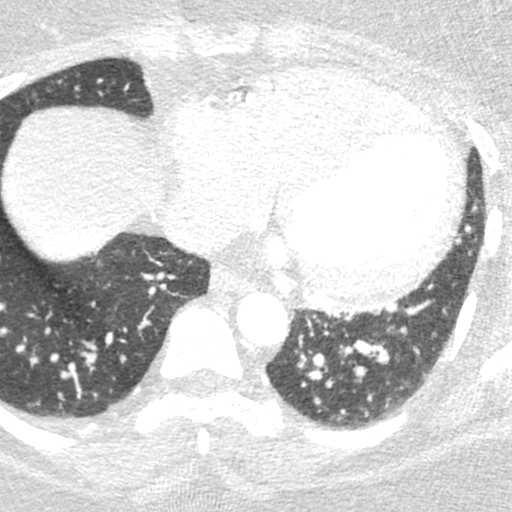
[im 205/308  lung]
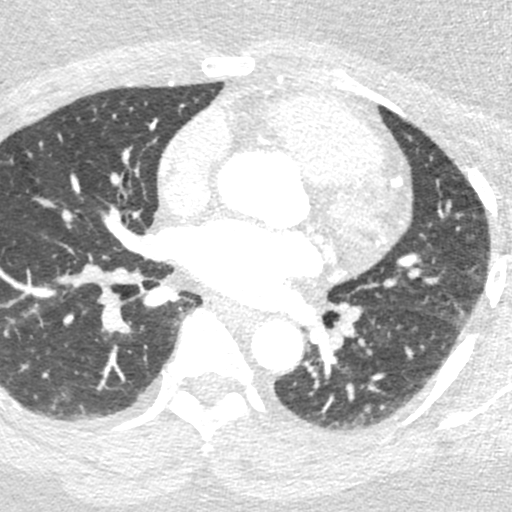

[Series 9: ts syst sharp 45 % · axial · 0.41mm/px · z∈[-415,-374]mm · 2 of 308 slices shown]
[im 103/308  lung]
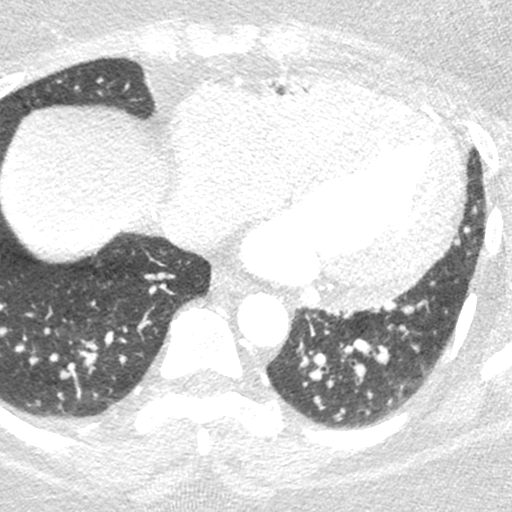
[im 205/308  lung]
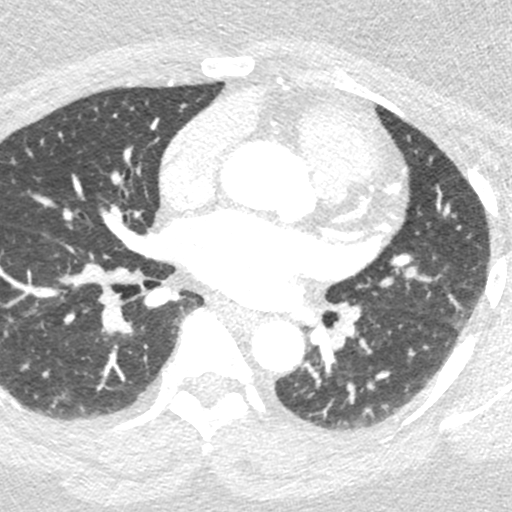

[6 of 20 positions shown; findings below may reference images not displayed]

FINDINGS: Quality: Fair, HR 76

Coronary calcium score: The patient's coronary artery calcium score
is 0, which places the patient in the 0 percentile.

Coronary arteries: Normal coronary origins.  Right dominance.

Right Coronary Artery: Dominant.  No disease.

Left Main Coronary Artery: Normal. Bifurcates into the LAD and LCx
arteries.

Left Anterior Descending Coronary Artery: Normal anterior vessel
that wraps around the apex, without disease. 2 small diagonal
branches.

Left Circumflex Artery: AV groove vessel without disease.

Aorta: Normal size, 28 mm at the mid ascending aorta (level of the
PA bifurcation) measured double oblique. No calcifications. No
dissection.

Aortic Valve: Trileaflet. No calcifications.

Other findings:

Normal pulmonary vein drainage into the left atrium.

Normal left atrial appendage without a thrombus.

Borderline dilated pulmonary artery at 25 mm, possibly suggestive of
pulmonary hypertension.
IMPRESSION: 1. No evidence of CAD, CADRADS = 0.

2. Coronary calcium score of 0. This was 0 percentile for age and
sex matched control.

3. Normal coronary origin with right dominance.

4. Borderline dilated pulmonary artery at 25 mm, possibly suggestive
of pulmonary hypertension.

5. Consider non-coronary causes of chest pain.

EXAM:
OVER-READ INTERPRETATION  CT CHEST

The following report is an over-read performed by radiologist Dr.
over-read does not include interpretation of cardiac or coronary
anatomy or pathology. The coronary CT and calcium scoring evaluation
interpretation by the cardiologist is attached. Imaging is targeted
to the mid chest and cardiac structures
FINDINGS: Cardiovascular: See dedicated report for cardiac details.

Mediastinum/Nodes: Unremarkable. No signs of adenopathy in the
visible chest.

Lungs/Pleura: Lungs are clear.  Visible airways are patent.

Upper Abdomen: Mild to moderate hepatic steatosis, liver is
incompletely evaluated. No acute upper abdominal findings.

Musculoskeletal: No acute bone finding. No destructive bone process.
Spinal degenerative changes.
IMPRESSION: No acute or significant extracardiac findings.

*** End of Addendum ***
FINDINGS: Quality: Fair, HR 76

Coronary calcium score: The patient's coronary artery calcium score
is 0, which places the patient in the 0 percentile.

Coronary arteries: Normal coronary origins.  Right dominance.

Right Coronary Artery: Dominant.  No disease.

Left Main Coronary Artery: Normal. Bifurcates into the LAD and LCx
arteries.

Left Anterior Descending Coronary Artery: Normal anterior vessel
that wraps around the apex, without disease. 2 small diagonal
branches.

Left Circumflex Artery: AV groove vessel without disease.

Aorta: Normal size, 28 mm at the mid ascending aorta (level of the
PA bifurcation) measured double oblique. No calcifications. No
dissection.

Aortic Valve: Trileaflet. No calcifications.

Other findings:

Normal pulmonary vein drainage into the left atrium.

Normal left atrial appendage without a thrombus.

Borderline dilated pulmonary artery at 25 mm, possibly suggestive of
pulmonary hypertension.
IMPRESSION: 1. No evidence of CAD, CADRADS = 0.

2. Coronary calcium score of 0. This was 0 percentile for age and
sex matched control.

3. Normal coronary origin with right dominance.

4. Borderline dilated pulmonary artery at 25 mm, possibly suggestive
of pulmonary hypertension.

5. Consider non-coronary causes of chest pain.

## 2023-12-23 ENCOUNTER — Other Ambulatory Visit (HOSPITAL_BASED_OUTPATIENT_CLINIC_OR_DEPARTMENT_OTHER): Payer: Self-pay

## 2023-12-23 MED ORDER — MOUNJARO 10 MG/0.5ML ~~LOC~~ SOAJ
10.0000 mg | SUBCUTANEOUS | 0 refills | Status: AC
  Filled 2023-12-23: qty 2, 28d supply, fill #0
